# Patient Record
Sex: Female | Born: 1935 | Race: Black or African American | Hispanic: No | Marital: Married | Smoking: Never smoker
Health system: Southern US, Community
[De-identification: ages and names within clinical notes are randomized; demographics above are authoritative.]

## PROBLEM LIST (undated history)

## (undated) DIAGNOSIS — I639 Cerebral infarction, unspecified: Secondary | ICD-10-CM

## (undated) DIAGNOSIS — IMO0001 Reserved for inherently not codable concepts without codable children: Secondary | ICD-10-CM

## (undated) DIAGNOSIS — C801 Malignant (primary) neoplasm, unspecified: Secondary | ICD-10-CM

## (undated) DIAGNOSIS — C7989 Secondary malignant neoplasm of other specified sites: Secondary | ICD-10-CM

## (undated) DIAGNOSIS — C50912 Malignant neoplasm of unspecified site of left female breast: Secondary | ICD-10-CM

## (undated) DIAGNOSIS — IMO0002 Reserved for concepts with insufficient information to code with codable children: Secondary | ICD-10-CM

## (undated) DIAGNOSIS — K5792 Diverticulitis of intestine, part unspecified, without perforation or abscess without bleeding: Secondary | ICD-10-CM

## (undated) HISTORY — DX: Cerebral infarction, unspecified: I63.9

## (undated) HISTORY — PX: COLON SURGERY: SHX602

## (undated) HISTORY — PX: MASTECTOMY: SHX3

## (undated) HISTORY — PX: BREAST SURGERY: SHX581

## (undated) HISTORY — DX: Diverticulitis of intestine, part unspecified, without perforation or abscess without bleeding: K57.92

## (undated) HISTORY — DX: Malignant (primary) neoplasm, unspecified: C80.1

---

## 1987-04-18 DIAGNOSIS — I639 Cerebral infarction, unspecified: Secondary | ICD-10-CM

## 1987-04-18 HISTORY — DX: Cerebral infarction, unspecified: I63.9

## 1997-09-26 ENCOUNTER — Emergency Department (HOSPITAL_COMMUNITY): Admission: EM | Admit: 1997-09-26 | Discharge: 1997-09-26 | Payer: Self-pay | Admitting: Emergency Medicine

## 1997-09-28 ENCOUNTER — Emergency Department (HOSPITAL_COMMUNITY): Admission: EM | Admit: 1997-09-28 | Discharge: 1997-09-28 | Payer: Self-pay | Admitting: Emergency Medicine

## 1997-10-04 ENCOUNTER — Emergency Department (HOSPITAL_COMMUNITY): Admission: EM | Admit: 1997-10-04 | Discharge: 1997-10-04 | Payer: Self-pay | Admitting: Emergency Medicine

## 1999-05-12 ENCOUNTER — Encounter: Payer: Self-pay | Admitting: Neurology

## 1999-05-12 ENCOUNTER — Inpatient Hospital Stay (HOSPITAL_COMMUNITY): Admission: EM | Admit: 1999-05-12 | Discharge: 1999-06-07 | Payer: Self-pay | Admitting: Emergency Medicine

## 1999-05-13 ENCOUNTER — Encounter: Payer: Self-pay | Admitting: Neurology

## 1999-05-14 ENCOUNTER — Encounter: Payer: Self-pay | Admitting: Neurology

## 1999-05-15 ENCOUNTER — Encounter: Payer: Self-pay | Admitting: Neurology

## 1999-05-16 ENCOUNTER — Encounter: Payer: Self-pay | Admitting: Neurology

## 1999-05-18 ENCOUNTER — Encounter: Payer: Self-pay | Admitting: Neurology

## 1999-05-19 ENCOUNTER — Encounter: Payer: Self-pay | Admitting: Neurology

## 1999-05-21 ENCOUNTER — Encounter: Payer: Self-pay | Admitting: Cardiology

## 1999-05-24 ENCOUNTER — Encounter: Payer: Self-pay | Admitting: Neurology

## 1999-05-26 ENCOUNTER — Encounter: Payer: Self-pay | Admitting: Neurology

## 1999-06-01 ENCOUNTER — Encounter: Payer: Self-pay | Admitting: Neurology

## 1999-06-07 ENCOUNTER — Inpatient Hospital Stay
Admission: RE | Admit: 1999-06-07 | Discharge: 1999-06-24 | Payer: Self-pay | Admitting: Physical Medicine & Rehabilitation

## 1999-06-08 ENCOUNTER — Ambulatory Visit (HOSPITAL_COMMUNITY)
Admission: RE | Admit: 1999-06-08 | Discharge: 1999-06-08 | Payer: Self-pay | Admitting: Physical Medicine & Rehabilitation

## 1999-06-13 ENCOUNTER — Encounter: Payer: Self-pay | Admitting: Physical Medicine & Rehabilitation

## 1999-06-23 ENCOUNTER — Encounter: Payer: Self-pay | Admitting: Physical Medicine & Rehabilitation

## 1999-06-24 ENCOUNTER — Inpatient Hospital Stay (HOSPITAL_COMMUNITY)
Admission: RE | Admit: 1999-06-24 | Discharge: 1999-07-06 | Payer: Self-pay | Admitting: Physical Medicine and Rehabilitation

## 2000-01-20 ENCOUNTER — Encounter: Payer: Self-pay | Admitting: Emergency Medicine

## 2000-01-20 ENCOUNTER — Emergency Department (HOSPITAL_COMMUNITY): Admission: EM | Admit: 2000-01-20 | Discharge: 2000-01-20 | Payer: Self-pay | Admitting: Emergency Medicine

## 2000-01-27 ENCOUNTER — Ambulatory Visit (HOSPITAL_COMMUNITY): Admission: RE | Admit: 2000-01-27 | Discharge: 2000-01-27 | Payer: Self-pay | Admitting: *Deleted

## 2000-07-16 ENCOUNTER — Other Ambulatory Visit: Admission: RE | Admit: 2000-07-16 | Discharge: 2000-07-16 | Payer: Self-pay | Admitting: *Deleted

## 2001-09-05 ENCOUNTER — Encounter: Payer: Self-pay | Admitting: Emergency Medicine

## 2001-09-05 ENCOUNTER — Emergency Department (HOSPITAL_COMMUNITY): Admission: EM | Admit: 2001-09-05 | Discharge: 2001-09-05 | Payer: Self-pay | Admitting: Emergency Medicine

## 2002-01-14 ENCOUNTER — Encounter: Payer: Self-pay | Admitting: Internal Medicine

## 2002-01-14 ENCOUNTER — Encounter: Admission: RE | Admit: 2002-01-14 | Discharge: 2002-01-14 | Payer: Self-pay | Admitting: Internal Medicine

## 2002-01-29 ENCOUNTER — Encounter: Admission: RE | Admit: 2002-01-29 | Discharge: 2002-01-29 | Payer: Self-pay | Admitting: Internal Medicine

## 2002-01-29 ENCOUNTER — Encounter: Payer: Self-pay | Admitting: Internal Medicine

## 2002-01-31 ENCOUNTER — Encounter (INDEPENDENT_AMBULATORY_CARE_PROVIDER_SITE_OTHER): Payer: Self-pay

## 2002-01-31 ENCOUNTER — Encounter: Admission: RE | Admit: 2002-01-31 | Discharge: 2002-01-31 | Payer: Self-pay | Admitting: Internal Medicine

## 2002-01-31 ENCOUNTER — Encounter: Payer: Self-pay | Admitting: Internal Medicine

## 2002-02-12 ENCOUNTER — Encounter: Payer: Self-pay | Admitting: Internal Medicine

## 2002-02-12 ENCOUNTER — Encounter: Admission: RE | Admit: 2002-02-12 | Discharge: 2002-02-12 | Payer: Self-pay | Admitting: Internal Medicine

## 2002-02-12 ENCOUNTER — Encounter (INDEPENDENT_AMBULATORY_CARE_PROVIDER_SITE_OTHER): Payer: Self-pay | Admitting: *Deleted

## 2003-09-10 ENCOUNTER — Encounter: Admission: RE | Admit: 2003-09-10 | Discharge: 2003-09-10 | Payer: Self-pay | Admitting: General Surgery

## 2003-09-16 ENCOUNTER — Encounter (INDEPENDENT_AMBULATORY_CARE_PROVIDER_SITE_OTHER): Payer: Self-pay | Admitting: General Surgery

## 2003-09-16 ENCOUNTER — Encounter (INDEPENDENT_AMBULATORY_CARE_PROVIDER_SITE_OTHER): Payer: Self-pay | Admitting: Specialist

## 2003-09-16 ENCOUNTER — Inpatient Hospital Stay (HOSPITAL_COMMUNITY): Admission: RE | Admit: 2003-09-16 | Discharge: 2003-09-17 | Payer: Self-pay | Admitting: General Surgery

## 2003-11-23 ENCOUNTER — Ambulatory Visit (HOSPITAL_COMMUNITY): Admission: RE | Admit: 2003-11-23 | Discharge: 2003-11-23 | Payer: Self-pay | Admitting: Oncology

## 2003-12-04 ENCOUNTER — Ambulatory Visit (HOSPITAL_COMMUNITY): Admission: RE | Admit: 2003-12-04 | Discharge: 2003-12-04 | Payer: Self-pay | Admitting: Oncology

## 2003-12-31 ENCOUNTER — Encounter (INDEPENDENT_AMBULATORY_CARE_PROVIDER_SITE_OTHER): Payer: Self-pay | Admitting: *Deleted

## 2003-12-31 ENCOUNTER — Ambulatory Visit (HOSPITAL_COMMUNITY): Admission: RE | Admit: 2003-12-31 | Discharge: 2004-01-01 | Payer: Self-pay | Admitting: General Surgery

## 2003-12-31 HISTORY — PX: LYMPH NODE DISSECTION: SHX5087

## 2004-01-18 ENCOUNTER — Ambulatory Visit: Admission: RE | Admit: 2004-01-18 | Discharge: 2004-02-23 | Payer: Self-pay | Admitting: *Deleted

## 2004-03-09 ENCOUNTER — Ambulatory Visit: Payer: Self-pay | Admitting: Oncology

## 2004-03-15 ENCOUNTER — Encounter: Admission: RE | Admit: 2004-03-15 | Discharge: 2004-03-15 | Payer: Self-pay | Admitting: Oncology

## 2004-05-09 ENCOUNTER — Ambulatory Visit: Payer: Self-pay | Admitting: Oncology

## 2004-07-08 ENCOUNTER — Ambulatory Visit: Payer: Self-pay | Admitting: Oncology

## 2004-09-13 ENCOUNTER — Encounter: Admission: RE | Admit: 2004-09-13 | Discharge: 2004-09-13 | Payer: Self-pay | Admitting: Oncology

## 2004-10-07 ENCOUNTER — Ambulatory Visit: Payer: Self-pay | Admitting: Oncology

## 2005-02-22 ENCOUNTER — Ambulatory Visit: Payer: Self-pay | Admitting: Oncology

## 2005-06-21 ENCOUNTER — Ambulatory Visit: Payer: Self-pay | Admitting: Oncology

## 2005-09-18 ENCOUNTER — Encounter: Admission: RE | Admit: 2005-09-18 | Discharge: 2005-09-18 | Payer: Self-pay | Admitting: Oncology

## 2005-10-11 ENCOUNTER — Ambulatory Visit: Payer: Self-pay | Admitting: Oncology

## 2005-11-03 ENCOUNTER — Ambulatory Visit (HOSPITAL_COMMUNITY): Admission: RE | Admit: 2005-11-03 | Discharge: 2005-11-03 | Payer: Self-pay | Admitting: Oncology

## 2006-02-06 ENCOUNTER — Encounter: Admission: RE | Admit: 2006-02-06 | Discharge: 2006-02-06 | Payer: Self-pay | Admitting: Oncology

## 2006-02-08 ENCOUNTER — Ambulatory Visit (HOSPITAL_COMMUNITY): Admission: RE | Admit: 2006-02-08 | Discharge: 2006-02-08 | Payer: Self-pay | Admitting: Oncology

## 2006-02-21 ENCOUNTER — Ambulatory Visit: Payer: Self-pay | Admitting: Oncology

## 2006-03-02 LAB — CBC WITH DIFFERENTIAL/PLATELET
BASO%: 0.3 % (ref 0.0–2.0)
Basophils Absolute: 0 10*3/uL (ref 0.0–0.1)
EOS%: 0.6 % (ref 0.0–7.0)
HGB: 14.2 g/dL (ref 11.6–15.9)
MCH: 29.5 pg (ref 26.0–34.0)
MCHC: 34 g/dL (ref 32.0–36.0)
RDW: 14.6 % — ABNORMAL HIGH (ref 11.3–14.5)
lymph#: 1.4 10*3/uL (ref 0.9–3.3)

## 2006-03-02 LAB — COMPREHENSIVE METABOLIC PANEL
ALT: 14 U/L (ref 0–35)
AST: 19 U/L (ref 0–37)
Albumin: 4.2 g/dL (ref 3.5–5.2)
Calcium: 9.1 mg/dL (ref 8.4–10.5)
Chloride: 105 mEq/L (ref 96–112)
Potassium: 4.5 mEq/L (ref 3.5–5.3)
Sodium: 139 mEq/L (ref 135–145)
Total Protein: 7.1 g/dL (ref 6.0–8.3)

## 2006-06-27 ENCOUNTER — Ambulatory Visit: Payer: Self-pay | Admitting: Oncology

## 2006-06-29 LAB — LACTATE DEHYDROGENASE: LDH: 165 U/L (ref 94–250)

## 2006-06-29 LAB — COMPREHENSIVE METABOLIC PANEL
Albumin: 4.2 g/dL (ref 3.5–5.2)
CO2: 25 mEq/L (ref 19–32)
Calcium: 9.7 mg/dL (ref 8.4–10.5)
Glucose, Bld: 90 mg/dL (ref 70–99)
Potassium: 4.7 mEq/L (ref 3.5–5.3)
Sodium: 141 mEq/L (ref 135–145)
Total Protein: 7.2 g/dL (ref 6.0–8.3)

## 2006-06-29 LAB — CBC WITH DIFFERENTIAL/PLATELET
Eosinophils Absolute: 0 10*3/uL (ref 0.0–0.5)
LYMPH%: 22.5 % (ref 14.0–48.0)
MONO#: 0.3 10*3/uL (ref 0.1–0.9)
NEUT#: 4.3 10*3/uL (ref 1.5–6.5)
Platelets: 216 10*3/uL (ref 145–400)
RBC: 5.02 10*6/uL (ref 3.70–5.32)
WBC: 6.1 10*3/uL (ref 3.9–10.0)

## 2006-06-29 LAB — CANCER ANTIGEN 27.29: CA 27.29: 32 U/mL (ref 0–39)

## 2006-10-24 ENCOUNTER — Ambulatory Visit: Payer: Self-pay | Admitting: Oncology

## 2006-10-24 ENCOUNTER — Encounter: Admission: RE | Admit: 2006-10-24 | Discharge: 2006-10-24 | Payer: Self-pay | Admitting: Oncology

## 2006-10-26 LAB — CBC WITH DIFFERENTIAL/PLATELET
Basophils Absolute: 0 10*3/uL (ref 0.0–0.1)
EOS%: 0.5 % (ref 0.0–7.0)
HCT: 41.6 % (ref 34.8–46.6)
HGB: 14.4 g/dL (ref 11.6–15.9)
MCH: 29.8 pg (ref 26.0–34.0)
MCV: 86 fL (ref 81.0–101.0)
MONO%: 7.1 % (ref 0.0–13.0)
NEUT%: 61.3 % (ref 39.6–76.8)
Platelets: 220 10*3/uL (ref 145–400)

## 2006-10-26 LAB — COMPREHENSIVE METABOLIC PANEL
AST: 19 U/L (ref 0–37)
Alkaline Phosphatase: 61 U/L (ref 39–117)
BUN: 12 mg/dL (ref 6–23)
Calcium: 9.1 mg/dL (ref 8.4–10.5)
Chloride: 104 mEq/L (ref 96–112)
Creatinine, Ser: 1.15 mg/dL (ref 0.40–1.20)
Glucose, Bld: 83 mg/dL (ref 70–99)

## 2006-10-26 LAB — CANCER ANTIGEN 27.29: CA 27.29: 25 U/mL (ref 0–39)

## 2007-04-30 ENCOUNTER — Ambulatory Visit: Payer: Self-pay | Admitting: Oncology

## 2007-05-02 LAB — CBC WITH DIFFERENTIAL/PLATELET
BASO%: 0.1 % (ref 0.0–2.0)
Basophils Absolute: 0 10*3/uL (ref 0.0–0.1)
EOS%: 0.5 % (ref 0.0–7.0)
Eosinophils Absolute: 0 10*3/uL (ref 0.0–0.5)
HCT: 42.5 % (ref 34.8–46.6)
HGB: 14.4 g/dL (ref 11.6–15.9)
LYMPH%: 18.4 % (ref 14.0–48.0)
MCH: 29.2 pg (ref 26.0–34.0)
MCHC: 33.8 g/dL (ref 32.0–36.0)
MCV: 86.6 fL (ref 81.0–101.0)
MONO#: 0.4 10*3/uL (ref 0.1–0.9)
MONO%: 7.4 % (ref 0.0–13.0)
NEUT#: 4.1 10*3/uL (ref 1.5–6.5)
NEUT%: 73.6 % (ref 39.6–76.8)
Platelets: 191 10*3/uL (ref 145–400)
RBC: 4.91 10*6/uL (ref 3.70–5.32)
RDW: 14.9 % — ABNORMAL HIGH (ref 11.3–14.5)
WBC: 5.6 10*3/uL (ref 3.9–10.0)
lymph#: 1 10*3/uL (ref 0.9–3.3)

## 2007-05-02 LAB — COMPREHENSIVE METABOLIC PANEL
ALT: 17 U/L (ref 0–35)
Albumin: 4.2 g/dL (ref 3.5–5.2)
CO2: 24 mEq/L (ref 19–32)
Calcium: 9.2 mg/dL (ref 8.4–10.5)
Chloride: 102 mEq/L (ref 96–112)
Glucose, Bld: 84 mg/dL (ref 70–99)
Potassium: 4.1 mEq/L (ref 3.5–5.3)
Sodium: 138 mEq/L (ref 135–145)
Total Bilirubin: 1 mg/dL (ref 0.3–1.2)
Total Protein: 7.1 g/dL (ref 6.0–8.3)

## 2007-05-12 LAB — VITAMIN D PNL(25-HYDRXY+1,25-DIHY)-BLD: Vit D, 25-Hydroxy: 50 ng/mL (ref 30–89)

## 2007-10-28 ENCOUNTER — Encounter: Admission: RE | Admit: 2007-10-28 | Discharge: 2007-10-28 | Payer: Self-pay | Admitting: Oncology

## 2007-11-07 ENCOUNTER — Ambulatory Visit: Payer: Self-pay | Admitting: Oncology

## 2007-11-12 LAB — CBC WITH DIFFERENTIAL/PLATELET
EOS%: 0.4 % (ref 0.0–7.0)
Eosinophils Absolute: 0 10*3/uL (ref 0.0–0.5)
MCH: 29 pg (ref 26.0–34.0)
MCV: 86 fL (ref 81.0–101.0)
MONO%: 6.5 % (ref 0.0–13.0)
NEUT#: 3.8 10*3/uL (ref 1.5–6.5)
RBC: 4.92 10*6/uL (ref 3.70–5.32)
RDW: 15.6 % — ABNORMAL HIGH (ref 11.3–14.5)
lymph#: 1.5 10*3/uL (ref 0.9–3.3)

## 2007-11-13 LAB — COMPREHENSIVE METABOLIC PANEL
ALT: 19 U/L (ref 0–35)
AST: 23 U/L (ref 0–37)
Albumin: 4.2 g/dL (ref 3.5–5.2)
Alkaline Phosphatase: 61 U/L (ref 39–117)
Potassium: 4.4 mEq/L (ref 3.5–5.3)
Sodium: 141 mEq/L (ref 135–145)
Total Protein: 7.1 g/dL (ref 6.0–8.3)

## 2007-11-13 LAB — VITAMIN D 25 HYDROXY (VIT D DEFICIENCY, FRACTURES): Vit D, 25-Hydroxy: 43 ng/mL (ref 30–89)

## 2007-11-13 LAB — LACTATE DEHYDROGENASE: LDH: 166 U/L (ref 94–250)

## 2008-02-10 ENCOUNTER — Encounter: Admission: RE | Admit: 2008-02-10 | Discharge: 2008-02-10 | Payer: Self-pay | Admitting: Oncology

## 2008-05-27 ENCOUNTER — Ambulatory Visit: Payer: Self-pay | Admitting: Oncology

## 2008-05-29 LAB — CBC WITH DIFFERENTIAL/PLATELET
Basophils Absolute: 0 10*3/uL (ref 0.0–0.1)
Eosinophils Absolute: 0.1 10*3/uL (ref 0.0–0.5)
HCT: 44.2 % (ref 34.8–46.6)
HGB: 14.9 g/dL (ref 11.6–15.9)
NEUT#: 4.3 10*3/uL (ref 1.5–6.5)
NEUT%: 67.6 % (ref 39.6–76.8)
RDW: 15.1 % — ABNORMAL HIGH (ref 11.3–14.5)
lymph#: 1.6 10*3/uL (ref 0.9–3.3)

## 2008-06-01 LAB — CANCER ANTIGEN 27.29: CA 27.29: 28 U/mL (ref 0–39)

## 2008-06-01 LAB — COMPREHENSIVE METABOLIC PANEL
Albumin: 4.1 g/dL (ref 3.5–5.2)
BUN: 20 mg/dL (ref 6–23)
CO2: 26 mEq/L (ref 19–32)
Calcium: 9.7 mg/dL (ref 8.4–10.5)
Chloride: 105 mEq/L (ref 96–112)
Glucose, Bld: 83 mg/dL (ref 70–99)
Potassium: 4.3 mEq/L (ref 3.5–5.3)

## 2008-06-01 LAB — LACTATE DEHYDROGENASE: LDH: 158 U/L (ref 94–250)

## 2008-06-01 LAB — VITAMIN D 25 HYDROXY (VIT D DEFICIENCY, FRACTURES): Vit D, 25-Hydroxy: 44 ng/mL (ref 30–89)

## 2008-10-28 ENCOUNTER — Encounter: Admission: RE | Admit: 2008-10-28 | Discharge: 2008-10-28 | Payer: Self-pay | Admitting: Oncology

## 2008-11-24 ENCOUNTER — Ambulatory Visit: Payer: Self-pay | Admitting: Oncology

## 2008-11-26 LAB — CBC WITH DIFFERENTIAL/PLATELET
BASO%: 0.3 % (ref 0.0–2.0)
LYMPH%: 18.4 % (ref 14.0–49.7)
MCHC: 33.4 g/dL (ref 31.5–36.0)
MCV: 87.7 fL (ref 79.5–101.0)
MONO%: 6.4 % (ref 0.0–14.0)
Platelets: 197 10*3/uL (ref 145–400)
RBC: 4.83 10*6/uL (ref 3.70–5.45)
RDW: 14.9 % — ABNORMAL HIGH (ref 11.2–14.5)
WBC: 6.3 10*3/uL (ref 3.9–10.3)

## 2008-11-27 LAB — COMPREHENSIVE METABOLIC PANEL
AST: 19 U/L (ref 0–37)
Alkaline Phosphatase: 64 U/L (ref 39–117)
BUN: 15 mg/dL (ref 6–23)
Glucose, Bld: 90 mg/dL (ref 70–99)
Potassium: 4.5 mEq/L (ref 3.5–5.3)
Sodium: 141 mEq/L (ref 135–145)
Total Bilirubin: 1 mg/dL (ref 0.3–1.2)
Total Protein: 6.9 g/dL (ref 6.0–8.3)

## 2008-11-27 LAB — CANCER ANTIGEN 27.29: CA 27.29: 26 U/mL (ref 0–39)

## 2008-11-27 LAB — VITAMIN D 25 HYDROXY (VIT D DEFICIENCY, FRACTURES): Vit D, 25-Hydroxy: 32 ng/mL (ref 30–89)

## 2009-10-29 ENCOUNTER — Encounter: Admission: RE | Admit: 2009-10-29 | Discharge: 2009-10-29 | Payer: Self-pay | Admitting: Oncology

## 2009-11-24 ENCOUNTER — Ambulatory Visit: Payer: Self-pay | Admitting: Oncology

## 2009-11-26 LAB — LACTATE DEHYDROGENASE: LDH: 159 U/L (ref 94–250)

## 2009-11-26 LAB — COMPREHENSIVE METABOLIC PANEL
ALT: 16 U/L (ref 0–35)
Albumin: 4.2 g/dL (ref 3.5–5.2)
CO2: 22 mEq/L (ref 19–32)
Calcium: 9.6 mg/dL (ref 8.4–10.5)
Chloride: 106 mEq/L (ref 96–112)
Creatinine, Ser: 1.11 mg/dL (ref 0.40–1.20)

## 2009-11-26 LAB — CBC WITH DIFFERENTIAL/PLATELET
BASO%: 0.3 % (ref 0.0–2.0)
Basophils Absolute: 0 10*3/uL (ref 0.0–0.1)
HCT: 43.7 % (ref 34.8–46.6)
HGB: 14.7 g/dL (ref 11.6–15.9)
MCHC: 33.7 g/dL (ref 31.5–36.0)
MONO#: 0.4 10*3/uL (ref 0.1–0.9)
NEUT%: 65.7 % (ref 38.4–76.8)
WBC: 6.8 10*3/uL (ref 3.9–10.3)
lymph#: 1.8 10*3/uL (ref 0.9–3.3)

## 2010-02-11 ENCOUNTER — Encounter: Admission: RE | Admit: 2010-02-11 | Discharge: 2010-02-11 | Payer: Self-pay | Admitting: Oncology

## 2010-05-07 ENCOUNTER — Other Ambulatory Visit: Payer: Self-pay | Admitting: Oncology

## 2010-05-07 ENCOUNTER — Encounter: Payer: Self-pay | Admitting: Oncology

## 2010-05-07 DIAGNOSIS — Z9012 Acquired absence of left breast and nipple: Secondary | ICD-10-CM

## 2010-09-02 NOTE — Op Note (Signed)
NAME:  Erin Beasley, Erin Beasley                        ACCOUNT NO.:  [id number]   MEDICAL RECORD NO.:  [id number]                   PATIENT TYPE:  OIB   LOCATION:  2899                                 FACILITY:  [facility]   PHYSICIAN:  Rose Phi. Maple Hudson, M.D.                DATE OF BIRTH:  [birth year]   DATE OF PROCEDURE:  12/31/2003  DATE OF DISCHARGE:                                 OPERATIVE REPORT   PREOPERATIVE DIAGNOSIS:  T2 N1 carcinoma of the left breast.   POSTOPERATIVE DIAGNOSIS:  T2 N1 carcinoma of the left breast.   OPERATION:  Left completion axillary lymph node dissection.   SURGEON:  Rose Phi. Maple Hudson, M.D.   ANESTHESIA:  General.   OPERATIVE PROCEDURE:  After suitable general endotracheal anesthesia was  induced, the patient was placed in a supine position with the arms extended  on the arm board.  The left axilla and chest wall were prepped and draped in  the usual fashion.  A transverse left axillary incision was made with  dissection through the subcutaneous tissue down to the fascia and we exposed  the pectoralis major muscle and dissected along it in a cephalad direction  and then expose the clavipectoral fascia overlying the axillary vein.  We  incised also along the pectoralis minor muscle and retracted that, and then  we dissected out and swept all the tissue from caudad to the vein and deep  to the pectoralis minor.  Long thoracic and thoracodorsal nerves were  preserved after identifying them.  The cutaneous branches, nerves and  vessels were clipped and divided.  After removing the axillary contents, we  had good hemostasis.  We thoroughly irrigated the field with saline.  A 19  Blake drain was inserted and brought out through a separate stab wound.  The  subcutaneous tissue was closed with 3-0 Monocryl and the skin with staples.  A dressing applied.  The patient transferred to the recovery room in  satisfactory condition having tolerated the procedure well.                                        Rose Phi. Maple Hudson, M.D.    PRY/MEDQ  D:  12/31/2003  T:  12/31/2003  Job:  [id number]   cc:   Pierce Crane, M.D.  [address] [facility]  [place]  [place] [address]  Fax: [phone]

## 2010-09-02 NOTE — Procedures (Signed)
[place]. [facility]  Patient:    Erin Beasley                        MRN: [id number] Proc. Date: 05/12/99 Adm. Date:  [id number] Attending:  Glean Hess D                           Procedure Report  PROCEDURE:  Endotracheal intubation.  SURGEON:  Westly Pam. Iran Planas, M.D.  INDICATIONS: 1. Airway protection. 2. Left middle cerebral artery infarction.  ENDOTRACHEAL TUBE:  Number 7.5.  SEDATION:  ________ 10 mg IV.  TECHNIQUE:  Direct laryngoscopy.  DESCRIPTION OF PROCEDURE:  The patients vocal cords were visualized directly and endotracheal tube, 7.5 was passed over vocal cords into the trachea without any  difficulties or complications.  The patient tolerated the procedure well without complications.  Bilateral air sounds were heard and end-tidal CO2 monitor coloration switched to yellow.  A chest x-ray was obtained for verification of placement of the tip of tube. DD:  05/12/99 TD:  05/14/99 Job: [id number]

## 2010-09-02 NOTE — Consult Note (Signed)
[place]. [facility]  Patient:    Erin Beasley, Erin Beasley                       MRN: [id number] Proc. Date: 05/30/99 Adm. Date:  [id number] Attending:  Baldo Daub CC:         Westly Pam. Iran Planas, M.D.                          Consultation Report  Ms. Iran Planas is a 77 year old female whom I am asked to see by Dr. Iran Planas in order to  place a PEG tube for feeding purposes.  She was admitted January 25 with acute neurologic problems secondary to an embolic CVA which probably was secondary to  atrial fibrillation.  She has right-sided weakness and aphasia.  She is having difficulty swallowing and is being fed through a nasogastric feeding tube.  The  patient is able to nod her understanding when I attempt to explain what a PEG tube is.  PAST MEDICAL HISTORY:  Pertinent for hypertension.  CURRENT MEDICATIONS:  Include metoprolol, Diltiazem, digoxin, Levaquin, Osmolite, Zantac, and 1 daily aspirin.  ALLERGIES:  PENICILLIN.  FAMILY HISTORY AND SOCIAL HISTORY:  Only available from the chart.  REVIEW OF SYSTEMS:  Only available from the chart.  PHYSICAL EXAMINATION:  GENERAL:  She is a well-developed, well-nourished, adult female in no acute distress.  She has obvious right-sided weakness.  VITAL SIGNS:  Temperature 101.3, blood pressure 105/40.  HEENT:  Eyes are anicteric.  Oropharynx is unremarkable.  CHEST:  Clear at the apices with some scattered rhonchi at the bases.  HEART:  Sounds are irregularly irregular.  ABDOMEN:  Soft with normal bowel sounds and without mass.  The patient nods in agreement that she has some lower abdominal discomfort on deep palpation.  RECTAL:  Exam is not performed.  EXTREMITIES:  Without edema.  LABORATORY TESTS:  Hemoglobin 9 with a white blood count of 15.5, platelet count 363.  PTT 41, PT 16.2.  The patient is currently on heparin.  IMPRESSION:  A 77 year old female status post an embolic cerebrovascular accident with  dysphagia.  She is an appropriate candidate for PEG tube placement if her dysphagia persists; however, her heparin would have to be held for over 4 hours, and aspirin should be held for 4 days.  I would prefer that she is not febrile nd that this issue is sorted out a little more thoroughly before we proceed with PEG placement.  At any rate, this should be delayed until her aspirin antiplatelet effects have worn off somewhat.  I have scheduled her PEG placement for Thursday morning at 8.  Please hold the aspirin until afterwards, and we will stop the heparin Wednesday night and resume both following the PEG.  I will return to further discuss this with the patient and the family. DD:  05/30/99 TD:  05/30/99 Job: [id number]

## 2010-09-02 NOTE — H&P (Signed)
[place]. [facility]  Patient:    Erin Beasley                        MRN: [id number] Adm. Date:  [id number] Attending:  Glean Hess D                         History and Physical  CHIEF COMPLAINT:  Unable to speak, with right-sided weakness.  HISTORY OF PRESENT ILLNESS:  The patient is a 77 year old woman who was brought by EMS after she was found by her husband at around 9:45 a.m. this morning sitting in a chair leaning towards the right side, weak on her right side, and unable to speak.  According to the patients husband the last time she was seen in her usual state of health awake and alert, was at around 8:45 a.m.  Her husband went out o have a haircut, and when he returned to his house at around 9:45 a.m., he found the patient in this condition.  The patients husband called EMS, and she was transported urgently to [facility] Emergency Room for further  evaluation.  There is no prior history of strokes or TIAs.  Upon arrival to the emergency room it was noted that the patient was in atrial fibrillation with a rapid ventricular response rate between 170-180 per minute.  She was treated initially with hemodynamic support, IV fluids, normal saline, and given a dose of Cardizem as well.  PAST MEDICAL HISTORY:  Diverticulitis.  CURRENT MEDICATIONS:  None.  ALLERGIES:  PENICILLIN.  SOCIAL HISTORY:  Nonsmoker, nondrinker.  Lives with her husband.  Fairly independent.  FAMILY HISTORY:  Noncontributory.  Her primary care physician used to be Dr. Raynald Beasley, but he is currently retired, and they were in the process of getting a new primary care doctor.  REVIEW OF SYSTEMS:  Unable to obtain, due to the patients aphasia.  PHYSICAL EXAMINATION:  VITAL SIGNS:  Blood pressure 130/79, pulse 150, respirations 20, temperature 99.5 degrees.  Oxygen saturation 98% on 2 L nasal cannula.  GENERAL:  The patient is moderately obese,  laying on the stretcher, in no distress.  HEENT:  Head normocephalic, atraumatic.  NECK:  Supple, no bruits.  LUNGS:  Clear bilaterally.  HEART:  Sounds irregularly irregular.  ABDOMEN:  Soft, bowel sounds present, no visceromegaly.  EXTREMITIES:  No cyanosis or edema.  NEUROLOGIC:  The patient is awake and alert, opened her eyes, but she is globally aphasic.  She does not follow commands very properly.  Pupils equal, reactive bilaterally.  Extraocular cephalic movement intact.  There is no gaze deviation. Her face is slightly asymmetric with a right facial central palsy.  Her tongue s in the midline.  Motor examination displays a right hemiparesis involving the arm and the leg, as well as strength 2-3/5.  Deep tendon reflexes +1 throughout. Her plantar is upgoing on the right and downgoing on the left.  Gait was not evaluated at this time.  LABORATORY DATA:  WBC count 8.7, hemoglobin 12.3, hematocrit 37, platelets 416.  Differential:  Neutrophils 86%, lymphocytes 8%.  PTT 31, INR 1.5, sodium 141, potassium 4.4, chloride 110, bicarbonate 26, glucose 139, BUN 12, creatinine 0.9. Calcium 8.7, total protein 7.0, albumin 2.8, AST 25, ALT 22, bilirubin 0.7, CPK 29, MB 0.3, relative index 1.0.  Troponin less than 0.03.  Neuro imaging:  The patient is currently going to the CT  scanner as we speak.  IMPRESSION: 1. 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 2. Atrial fibrillation. 3. Diverticulosis.  The plan, recommendations, diagnosis, condition, and further intervention were discussed at length with the patients husband at the bedside.  The patient will be admitted to the neuro intensive care unit for further assessment, evaluation of her neurologic status, and control of her heart rate.  I personally directed the management and treatment to control her heart rate in the emergency room. Several doses of Cardizem have been given for this purpose intravenously.  At  the same ime her blood pressure was maintained with fluid resuscitation for hemodynamic support, several liters of normal saline.  Her heart rate has responded some from the 170s down to 120s to 130 at this time.  Her blood pressure was maintained in the range of a systolic of 110-130, with hemodynamic support on normal saline and several  doses of Aramine.  The patient also received a dose of aspirin here in the emergency room and was given oxygen 2 L.  The patient will be started on a heparin ischemic stroke protocol as soon as we know from her CAT scan that there is no intracranial hemorrhage.  I will expect that the patient will need long-term anticoagulation with Coumadin for stroke prevention.  At this time also we will  start her on a Cardizem drip to run at 50 mg per hour, to maintain her heart rate at less than 110.  The patient will remain n.p.o. and we will further obtain a swallowing evaluation by speech therapy in the morning.  We would also like to obtain a physical therapy and occupational therapy for further evaluation. Further studies will include noninvasive imaging of the intracranial and extracranial vessels, a carotid ultrasound, transcranial Doppler, and a cardiac evaluation with a 2-D echocardiogram.  Thank you for letting me participate in the care of this patient.  Total critical care time provided for the diagnosis, evaluation, and the management of this patient from 12:30 p.m. to 3:30 p.m., a total of three hours.DD:  05/12/99 TD:  05/12/99 Job: [id number]

## 2010-09-02 NOTE — Discharge Summary (Signed)
[place]. [facility]  Patient:    Erin Beasley, Erin Beasley                       MRN: [id number] Adm. Date:  [id number] Disc. Date: 06/23/99 Attending:  Herold Harms Dictator:   Bynum Bellows. Idacavage, P.A.C. CC:         Daniel L. Thomasena Edis, M.D.                           Discharge Summary  DIAGNOSES:  1. Status post cerebrovascular accident.  2. Atrial fibrillation.  3. Atrial thrombus.  4. Candida.  5. History of vancomycin-resistant enterococci urinary tract infection.  6. NPO status.  7. Prolonged ventilator wean.  HOSPITAL COURSE:  The patient is a 77 year old female who was initially brought by EMS on May 12, 1999 after being found by her husband, leaning to the right nd unable to speak.  She was noted to be in atrial fibrillation with a rapid ventricular response.  Her condition deteriorated at the emergency room and she  required intubation.  Initial CT was negative.  The patient was taken urgently o cerebral arteriogram.  No filling deficits were seen.  She was felt to have suffered a left MCA embolic infarct.  She was seen by Dr. Tresa Endo for AF.  Echo showed moderate TR.  The patient did fail obstipation and required tracheostomy  placement on February 5, and ultimately PEG tube on February 15.  A TEE performed on February 2 showed a mobile mass in the left atrial appendage consistent with  thrombus.  The patient initially had E. coli UTI which was treated with Cipro. She did have some bleeding from her tracheostomy and blood in her urine, which required her heparin to be held February 10.  She had anemia.  When she stabilized medically, she was transferred to subacute rehab on February 20, where she has participated in physical, occupational and speech therapies.  While on the SACU  unit, she was noted to have VRE UTI which was treated with Macrodantin.  She was also noted to have yeast in her urine, as well as oral thrush and yeast in  skin  folds, and she was placed on Diflucan for this.  Her trach was able to be decannulated on March 6 and she has tolerated this well.  She is for modified barium swallow today.  She has been NPO up until this time.  Currently she is moderate-assist with upper body dressing, max-assist with lower body dressing. She is minimal-assist with bed mobility, moderate-assist for sit to stand, and has ambulated 5 feet with a large-base quad cane and moderate-assist.  At this point in time, it is felt that she is a suitable candidate for transfer to the inpatient  rehab unit for further therapeutic intervention.  MEDICATIONS AT THE TIME OF TRANSFER:  1. Albuterol nebulizer 2.5 mg b.i.d. p.r.n.  2. Atrovent nebulizer 0.5 mg b.i.d. p.r.n.  3. Restoril 30 mg p.o. q.h.s. p.r.n.  4. Tylox every 4 hours per tube 5 ml.  5. Diflucan 100 mg suspension per tube q.d.  6. Coumadin 2 mg per tube q.d.  7. Senna 2 mg via PEG q.d.  8. Laxative of choice.  9. Tylenol 650 mg per tube q.4h. p.r.n. p.r.n. pain. 10. MicroGuard powder b.i.d. and p.r.n. 11. Jevity Plus 280 ml q.4h. per tube. 12. Cardizem suspension 40 mg q.6h. per tube. 13. Folvite 2 mg  per tube q.d. 14. Lopressor 50 mg q.12h. per tube. 15. Niferex 150 mg b.i.d. per tube. 16. Digoxin 0.25 mg q.o.d. alternating with 0.375 mg q.o.d. 17. Zantac 150 mg q.12h. per tube.  CONDITION ON DISCHARGE:  Stable. DD:  06/23/99 TD:  06/24/99 Job: [id number]

## 2010-10-31 ENCOUNTER — Ambulatory Visit: Payer: Self-pay

## 2010-11-08 ENCOUNTER — Ambulatory Visit
Admission: RE | Admit: 2010-11-08 | Discharge: 2010-11-08 | Disposition: A | Discharge status: Home / Self Care | Payer: Medicare Other | Source: Ambulatory Visit | Attending: Oncology | Admitting: Oncology

## 2010-11-08 DIAGNOSIS — Z9012 Acquired absence of left breast and nipple: Secondary | ICD-10-CM

## 2010-12-02 ENCOUNTER — Encounter (HOSPITAL_BASED_OUTPATIENT_CLINIC_OR_DEPARTMENT_OTHER): Payer: Medicare Other | Admitting: Oncology

## 2010-12-02 ENCOUNTER — Other Ambulatory Visit: Payer: Self-pay | Admitting: Oncology

## 2010-12-02 DIAGNOSIS — I4891 Unspecified atrial fibrillation: Secondary | ICD-10-CM

## 2010-12-02 DIAGNOSIS — C50919 Malignant neoplasm of unspecified site of unspecified female breast: Secondary | ICD-10-CM

## 2010-12-02 LAB — CBC WITH DIFFERENTIAL/PLATELET
Basophils Absolute: 0 10*3/uL (ref 0.0–0.1)
Eosinophils Absolute: 0.1 10*3/uL (ref 0.0–0.5)
HGB: 14.7 g/dL (ref 11.6–15.9)
MCV: 88.8 fL (ref 79.5–101.0)
MONO#: 0.4 10*3/uL (ref 0.1–0.9)
MONO%: 6.2 % (ref 0.0–14.0)
NEUT#: 4.5 10*3/uL (ref 1.5–6.5)
RBC: 4.97 10*6/uL (ref 3.70–5.45)
RDW: 14.8 % — ABNORMAL HIGH (ref 11.2–14.5)
WBC: 6.9 10*3/uL (ref 3.9–10.3)
lymph#: 1.9 10*3/uL (ref 0.9–3.3)

## 2010-12-02 LAB — COMPREHENSIVE METABOLIC PANEL
AST: 22 U/L (ref 0–37)
Albumin: 4.2 g/dL (ref 3.5–5.2)
Alkaline Phosphatase: 67 U/L (ref 39–117)
Calcium: 9.5 mg/dL (ref 8.4–10.5)
Chloride: 103 mEq/L (ref 96–112)
Glucose, Bld: 82 mg/dL (ref 70–99)
Potassium: 4.6 mEq/L (ref 3.5–5.3)
Sodium: 139 mEq/L (ref 135–145)
Total Protein: 7.2 g/dL (ref 6.0–8.3)

## 2010-12-09 ENCOUNTER — Encounter (HOSPITAL_BASED_OUTPATIENT_CLINIC_OR_DEPARTMENT_OTHER): Payer: Medicare Other | Admitting: Oncology

## 2011-11-10 ENCOUNTER — Telehealth: Payer: Self-pay | Admitting: Oncology

## 2011-11-10 ENCOUNTER — Other Ambulatory Visit: Payer: Self-pay | Admitting: Oncology

## 2011-11-10 DIAGNOSIS — Z1231 Encounter for screening mammogram for malignant neoplasm of breast: Secondary | ICD-10-CM

## 2011-11-10 DIAGNOSIS — Z853 Personal history of malignant neoplasm of breast: Secondary | ICD-10-CM

## 2011-11-10 NOTE — Telephone Encounter (Signed)
S/w pt and also husband re appts for 9/27 and 10/4 mammo 8/8.

## 2011-11-23 ENCOUNTER — Ambulatory Visit
Admission: RE | Admit: 2011-11-23 | Discharge: 2011-11-23 | Disposition: A | Discharge status: Home / Self Care | Payer: Medicare Other | Source: Ambulatory Visit | Attending: Oncology | Admitting: Oncology

## 2011-11-23 DIAGNOSIS — Z853 Personal history of malignant neoplasm of breast: Secondary | ICD-10-CM

## 2011-11-23 DIAGNOSIS — Z1231 Encounter for screening mammogram for malignant neoplasm of breast: Secondary | ICD-10-CM

## 2012-01-09 ENCOUNTER — Telehealth: Payer: Self-pay | Admitting: *Deleted

## 2012-01-09 NOTE — Telephone Encounter (Signed)
per reschedule to 01-23-2012 starting at 3:00pm patient confirmed over the phone the new date and time

## 2012-01-12 ENCOUNTER — Other Ambulatory Visit (HOSPITAL_BASED_OUTPATIENT_CLINIC_OR_DEPARTMENT_OTHER): Payer: Medicare Other | Admitting: Lab

## 2012-01-12 ENCOUNTER — Other Ambulatory Visit: Payer: Self-pay | Admitting: Oncology

## 2012-01-12 ENCOUNTER — Other Ambulatory Visit: Payer: Self-pay | Admitting: *Deleted

## 2012-01-12 DIAGNOSIS — C50919 Malignant neoplasm of unspecified site of unspecified female breast: Secondary | ICD-10-CM

## 2012-01-12 DIAGNOSIS — M949 Disorder of cartilage, unspecified: Secondary | ICD-10-CM

## 2012-01-12 DIAGNOSIS — M899 Disorder of bone, unspecified: Secondary | ICD-10-CM

## 2012-01-12 LAB — CBC WITH DIFFERENTIAL/PLATELET
BASO%: 0.4 % (ref 0.0–2.0)
Basophils Absolute: 0 10e3/uL (ref 0.0–0.1)
EOS%: 0.9 % (ref 0.0–7.0)
Eosinophils Absolute: 0.1 10e3/uL (ref 0.0–0.5)
HCT: 46.7 % — ABNORMAL HIGH (ref 34.8–46.6)
HGB: 15.5 g/dL (ref 11.6–15.9)
LYMPH%: 23.7 % (ref 14.0–49.7)
MCH: 29.7 pg (ref 25.1–34.0)
MCHC: 33.2 g/dL (ref 31.5–36.0)
MCV: 89.5 fL (ref 79.5–101.0)
MONO#: 0.5 10e3/uL (ref 0.1–0.9)
MONO%: 7.6 % (ref 0.0–14.0)
NEUT#: 4.6 10e3/uL (ref 1.5–6.5)
NEUT%: 67.4 % (ref 38.4–76.8)
Platelets: 207 10e3/uL (ref 145–400)
RBC: 5.22 10e6/uL (ref 3.70–5.45)
RDW: 14.9 % — ABNORMAL HIGH (ref 11.2–14.5)
WBC: 6.9 10e3/uL (ref 3.9–10.3)
lymph#: 1.6 10e3/uL (ref 0.9–3.3)

## 2012-01-12 LAB — COMPREHENSIVE METABOLIC PANEL (CC13)
AST: 25 U/L (ref 5–34)
Albumin: 3.6 g/dL (ref 3.5–5.0)
BUN: 19 mg/dL (ref 7.0–26.0)
CO2: 26 mEq/L (ref 22–29)
Calcium: 9.6 mg/dL (ref 8.4–10.4)
Chloride: 106 mEq/L (ref 98–107)
Creatinine: 1.1 mg/dL (ref 0.6–1.1)
Glucose: 92 mg/dl (ref 70–99)
Potassium: 4.5 mEq/L (ref 3.5–5.1)

## 2012-01-13 LAB — VITAMIN D 25 HYDROXY (VIT D DEFICIENCY, FRACTURES): Vit D, 25-Hydroxy: 61 ng/mL (ref 30–89)

## 2012-01-19 ENCOUNTER — Ambulatory Visit: Payer: Medicare Other | Admitting: Oncology

## 2012-01-23 ENCOUNTER — Ambulatory Visit (HOSPITAL_BASED_OUTPATIENT_CLINIC_OR_DEPARTMENT_OTHER): Payer: Medicare Other | Admitting: Oncology

## 2012-01-23 VITALS — BP 95/68 | HR 88 | Temp 97.6°F | Resp 20 | Ht 64.5 in | Wt 185.4 lb

## 2012-01-23 DIAGNOSIS — Z170 Estrogen receptor positive status [ER+]: Secondary | ICD-10-CM

## 2012-01-23 DIAGNOSIS — L905 Scar conditions and fibrosis of skin: Secondary | ICD-10-CM

## 2012-01-23 DIAGNOSIS — C50919 Malignant neoplasm of unspecified site of unspecified female breast: Secondary | ICD-10-CM

## 2012-01-23 NOTE — Progress Notes (Signed)
Hematology and Oncology Follow Up Visit  Erin Beasley [id number] 1928-06-18 78 y.o. 01/23/2012 9:20 AM   DIAGNOSIS:   78 year old woman with history of multinode-positive breast cancer status post mastectomy on left side, lymph node dissection September 2005, ER/PR positive on Arimidex x5 years.  History of strokes with resultant right-sided weakness  PAST THERAPY:    Interim History:  Patient is been doing well. She has no complaints. She is here today with her husband and daughter. She relates as does her daughter that she's been having some discomfort in her left scar line. Her most recent mammogram was in August of this year. Skin was normal.  Medications: I have reviewed the patient's current medications.  Allergies: Not on File  Past Medical History, Surgical history, Social history, and Family History were reviewed and updated.  Review of Systems: Constitutional:  Negative for fever, chills, night sweats, anorexia, weight loss, pain. Cardiovascular: no chest pain or dyspnea on exertion Respiratory: negative Neurological: negative Dermatological: negative ENT: negative Skin Gastrointestinal: negative Genito-Urinary: negative Hematological and Lymphatic: negative Breast: negative Musculoskeletal: negative Remaining ROS negative.  Physical Exam:  Blood pressure 95/68, pulse 88, temperature 97.6 F (36.4 C), resp. rate 20, height 5' 4.5" (1.638 m), weight 185 lb 6.4 oz (84.097 kg).  ECOG: 0  HEENT:  Sclerae anicteric, conjunctivae pink.  Oropharynx clear.  No mucositis or candidiasis.  Nodes:  No cervical, supraclavicular, or axillary lymphadenopathy palpated.  Breast Exam:  Right breast is benign.  No masses, discharge, skin change, or nipple inversion.  Left breast is status post mastectomy. Higher up in the scar line adjacent to the axilla there is a heaped up. Tissue  which has purplish discoloration. There is slight tenderness noted as well .Lungs:  Clear to  auscultation bilaterally.  No crackles, rhonchi, or wheezes.  Heart:  Regular rate and rhythm.  Abdomen:  Soft, nontender.  Positive bowel sounds.  No organomegaly or masses palpated.  Musculoskeletal:  No focal spinal tenderness to palpation.  Extremities:  Benign.  No peripheral edema or cyanosis.  Skin:  Benign.  Neuro:  Nonfocal.    Lab Results: Lab Results  Component Value Date   WBC 6.9 01/12/2012   HGB 15.5 01/12/2012   HCT 46.7* 01/12/2012   MCV 89.5 01/12/2012   PLT 207 01/12/2012     Chemistry      Component Value Date/Time   NA 141 01/12/2012 1332   NA 139 12/02/2010 1404   NA 139 12/02/2010 1404   K 4.5 01/12/2012 1332   K 4.6 12/02/2010 1404   K 4.6 12/02/2010 1404   CL 106 01/12/2012 1332   CL 103 12/02/2010 1404   CL 103 12/02/2010 1404   CO2 26 01/12/2012 1332   CO2 26 12/02/2010 1404   CO2 26 12/02/2010 1404   BUN 19.0 01/12/2012 1332   BUN 15 12/02/2010 1404   BUN 15 12/02/2010 1404   CREATININE 1.1 01/12/2012 1332   CREATININE 1.09 12/02/2010 1404   CREATININE 1.09 12/02/2010 1404      Component Value Date/Time   CALCIUM 9.6 01/12/2012 1332   CALCIUM 9.5 12/02/2010 1404   CALCIUM 9.5 12/02/2010 1404   ALKPHOS 69 01/12/2012 1332   ALKPHOS 67 12/02/2010 1404   ALKPHOS 67 12/02/2010 1404   AST 25 01/12/2012 1332   AST 22 12/02/2010 1404   AST 22 12/02/2010 1404   ALT 18 01/12/2012 1332   ALT 13 12/02/2010 1404   ALT 13 12/02/2010 1404   BILITOT  0.60 01/12/2012 1332   BILITOT 0.8 12/02/2010 1404   BILITOT 0.8 12/02/2010 1404       Radiological Studies:  No results found.   IMPRESSIONS AND PLAN: A 78 y.o. female with   History of multinode-positive breast cancer status post mastectomy on  AI therapy. She sees you doing well. She does have ongoing residual weakness on the right side. From a oncologic point of view I am little concerned about the appearance of the scar. I should also mention of some of his other smaller tiny erythematous acneform-type lesions on her anterior chest  which may represent dermatitis I recommended that she followup with a surgeon to see this ends be biopsied in the axilla. I plan to see her in followup in 3 months time as followup.Marland Kitchen   Spent more than half the time coordinating care, as well as discussion of BMI and its implications.      Primo Innis 10/8/20139:20 AM Cell [id number]

## 2012-01-24 ENCOUNTER — Telehealth (INDEPENDENT_AMBULATORY_CARE_PROVIDER_SITE_OTHER): Payer: Self-pay | Admitting: General Surgery

## 2012-01-24 ENCOUNTER — Telehealth: Payer: Self-pay | Admitting: Oncology

## 2012-01-24 NOTE — Telephone Encounter (Signed)
Spoke with patient and informed her that Dr. Donnie Coffin wanted her to be seen by Dr. Mauri Reading.  She said that she would like this so I set her an appt with [place] on 10/15 at 2:30.  I also sent her a reminder card in the mail.

## 2012-01-24 NOTE — Telephone Encounter (Signed)
Received a phone call from Erin Beasley at [facility] and the pt has been scheduled an appt with dr Cheral Almas and she is aware

## 2012-01-25 ENCOUNTER — Telehealth: Payer: Self-pay | Admitting: Oncology

## 2012-01-25 NOTE — Telephone Encounter (Signed)
S/w the pt and she is aware of her jan 2014 appts °

## 2012-01-30 ENCOUNTER — Encounter (INDEPENDENT_AMBULATORY_CARE_PROVIDER_SITE_OTHER): Payer: Self-pay | Admitting: General Surgery

## 2012-01-30 ENCOUNTER — Ambulatory Visit (INDEPENDENT_AMBULATORY_CARE_PROVIDER_SITE_OTHER): Payer: Medicare Other | Admitting: General Surgery

## 2012-01-30 VITALS — BP 104/68 | HR 74 | Temp 97.8°F | Resp 16 | Ht 64.0 in | Wt 185.2 lb

## 2012-01-30 DIAGNOSIS — R222 Localized swelling, mass and lump, trunk: Secondary | ICD-10-CM

## 2012-01-30 DIAGNOSIS — C50919 Malignant neoplasm of unspecified site of unspecified female breast: Secondary | ICD-10-CM

## 2012-01-30 NOTE — Progress Notes (Signed)
Patient ID: Erin Beasley, female   DOB: [birth year], 78 y.o.   MRN: [id number]  Chief Complaint  Patient presents with  . Breast Cancer Long Term Follow Up    HPI Erin Beasley is a 78 y.o. female.  Referred by Dr. Pierce Beasley HPI This is an 78 year old female with a history of node-positive left breast cancer treated with a left modified radical mastectomy and antiestrogen therapy for 5 years afterwards. She has been doing well since then. She a mammogram on the right side in August that was normal. She has noticed over the past several weeks and this was also noted by Dr. Donnie Beasley her medical oncologist and abnormality at the lateral aspect of her left mastectomy scar. This area is not really bothered her except it has been present. She comes in today to have this evaluated for possible biopsy. Past Medical History  Diagnosis Date  . Cancer 2008    breast   . Stroke   . Diverticulitis     Past Surgical History  Procedure Date  . Colon surgery   . Breast surgery     left mrm    Family History  Problem Relation Age of Onset  . Ovarian cancer Sister     Social History History  Substance Use Topics  . Smoking status: Never Smoker   . Smokeless tobacco: Not on file  . Alcohol Use: No    No Known Allergies  Current Outpatient Prescriptions  Medication Sig Dispense Refill  . calcium carbonate (TUMS - DOSED IN MG ELEMENTAL CALCIUM) 500 MG chewable tablet Chew 1 tablet by mouth daily.      . Cholecalciferol (VITAMIN D PO) Take by mouth.      . diltiazem (CARDIZEM CD) 180 MG 24 hr capsule       . famotidine (PEPCID) 20 MG tablet       . metoprolol (LOPRESSOR) 50 MG tablet       . simvastatin (ZOCOR) 20 MG tablet       . warfarin (COUMADIN) 5 MG tablet         Review of Systems Review of Systems  Constitutional: Negative for fever, chills and unexpected weight change.  HENT: Negative for hearing loss, congestion, sore throat, trouble swallowing and voice change.   Eyes:  Negative for visual disturbance.  Respiratory: Negative for cough and wheezing.   Cardiovascular: Negative for chest pain, palpitations and leg swelling.  Gastrointestinal: Negative for nausea, vomiting, abdominal pain, diarrhea, constipation, blood in stool, abdominal distention and anal bleeding.  Genitourinary: Negative for hematuria, vaginal bleeding and difficulty urinating.  Musculoskeletal: Negative for arthralgias.  Skin: Negative for rash and wound.  Neurological: Negative for seizures, syncope and headaches.  Hematological: Negative for adenopathy. Does not bruise/bleed easily.  Psychiatric/Behavioral: Negative for confusion.    Blood pressure 104/68, pulse 74, temperature 97.8 F (36.6 C), resp. rate 16, height 5\' 4"  (1.626 m), weight 185 lb 3.2 oz (84.006 kg).  Physical Exam Physical Exam  Pulmonary/Chest:      Data Reviewed Findings: The breast tissue is heterogeneously dense. No  suspicious masses, architectural distortion, or calcifications are  present.  Images were processed with CAD.  IMPRESSION:  No mammographic evidence of malignancy.  A result letter of this screening mammogram will be mailed directly  to the patient.    Assessment    History left breast cancer, possible chest wall recurrence    Plan    I'm concerned that this area is a recurrence in  her mastectomy scar. It certainly looks concerning. She is on Coumadin and they state that her last INR was 3 right now. I would like her off Coumadin for a couple of days prior to doing this. Will ask her primary care physician if we can wean her off her Coumadin for 3 days and then plan on doing a biopsy in the office. If this is negative we may need to excise this area. If this is positive then we can evaluate her decide to move forward.       Erin Beasley 01/30/2012, 2:40 PM

## 2012-02-05 ENCOUNTER — Telehealth (INDEPENDENT_AMBULATORY_CARE_PROVIDER_SITE_OTHER): Payer: Self-pay | Admitting: General Surgery

## 2012-02-05 NOTE — Telephone Encounter (Signed)
Spoke with patient and her husband and explained that per Dr. Dwain Sarna, he would like her to stop her coumadin tomorrow until Friday after we do her punch bx.  I have her scheduled Friday 10/25 at 11:40.  I also contacted their daughter.

## 2012-02-05 NOTE — Telephone Encounter (Signed)
Erin Beasley from Dr. Paulita Fujita office called to let me know that he said it was okay for her to stop her coumadin 4 days before the punch bx.

## 2012-02-09 ENCOUNTER — Other Ambulatory Visit (INDEPENDENT_AMBULATORY_CARE_PROVIDER_SITE_OTHER): Payer: Self-pay | Admitting: General Surgery

## 2012-02-09 ENCOUNTER — Ambulatory Visit (INDEPENDENT_AMBULATORY_CARE_PROVIDER_SITE_OTHER): Payer: Medicare Other | Admitting: General Surgery

## 2012-02-09 ENCOUNTER — Encounter (INDEPENDENT_AMBULATORY_CARE_PROVIDER_SITE_OTHER): Payer: Self-pay | Admitting: General Surgery

## 2012-02-09 VITALS — BP 125/79 | HR 81 | Temp 97.9°F | Resp 16 | Ht 65.0 in | Wt 186.0 lb

## 2012-02-09 DIAGNOSIS — C50919 Malignant neoplasm of unspecified site of unspecified female breast: Secondary | ICD-10-CM

## 2012-02-09 DIAGNOSIS — R222 Localized swelling, mass and lump, trunk: Secondary | ICD-10-CM

## 2012-02-09 HISTORY — PX: PUNCH BIOPSY OF SKIN: SHX6390

## 2012-02-09 NOTE — Patient Instructions (Addendum)
Remove dressing on Sunday morning unless need to sooner. Restart Coumadin on Saturday Leave biopsy site covered with gauze once daily until I see you again

## 2012-02-14 ENCOUNTER — Telehealth (INDEPENDENT_AMBULATORY_CARE_PROVIDER_SITE_OTHER): Payer: Self-pay

## 2012-02-14 NOTE — Progress Notes (Signed)
Subjective:     Patient ID: Erin Beasley, female   DOB: Oct 28, 1928, 78 y.o.   MRN: [id number]  HPI 27 yof who I saw last week with what appeared to be recurrence on left chest wall. She stopped her coumadin a couple days ago and comes in for biopsy in the office today.  She reports no changes.  Review of Systems     Objective:   Physical Exam Hard nodular mass with some bleeding right chest wall concerning for recurrence    Assessment:     Right chest wall mass    Plan:     We discussed the procedure.  I cleansed the area and then anesthetized with 1% lidocaine.  I then did a 6 mm punch biopsy times two in the mass as well as the nodularity.  I placed two stitches in these areas and held pressure until hemostasis was obtained.  A dressing was applied.  Will follow up next week to remove stitches.

## 2012-02-14 NOTE — Telephone Encounter (Signed)
Erin Beasley at [name] called stating punch bx came back met. Adeno CA ductal CA breast with margins involved. Pathologist wants to know if Dr Dwain Sarna wants erpr [place] ect. Ran on specimen. Pt [name] request Erin Beasley advised to run all tests.

## 2012-02-16 ENCOUNTER — Ambulatory Visit (INDEPENDENT_AMBULATORY_CARE_PROVIDER_SITE_OTHER): Payer: Medicare Other | Admitting: General Surgery

## 2012-02-16 ENCOUNTER — Encounter (INDEPENDENT_AMBULATORY_CARE_PROVIDER_SITE_OTHER): Payer: Self-pay | Admitting: General Surgery

## 2012-02-16 VITALS — BP 100/70 | HR 56 | Temp 98.2°F | Resp 16 | Ht 65.0 in | Wt 183.0 lb

## 2012-02-16 DIAGNOSIS — C50919 Malignant neoplasm of unspecified site of unspecified female breast: Secondary | ICD-10-CM

## 2012-02-16 NOTE — Progress Notes (Signed)
Subjective:     Patient ID: Erin Beasley, female   DOB: [birth year], 78 y.o.   MRN: [id number]  HPI 68 yof who had left chest wall mass after prior mastectomy.  I did biopsy last week and is consistent with ductal carcinoma of the breast. She returned today for stitch removal as well as to discuss this diagnosis. Review of Systems     Objective:   Physical Exam unchanged    Assessment:     Metastatic breast cancer    Plan:     I removed the stitch today. I discussed her case with medical oncology and I'm going to send her for a PET CT scan. We also discussed an excision of this area as it is causing her some pain in his grown out of her skin if it is feasible. I will plan on seeing her after her PET CT.

## 2012-02-23 ENCOUNTER — Encounter (HOSPITAL_COMMUNITY)
Admission: RE | Admit: 2012-02-23 | Discharge: 2012-02-23 | Disposition: A | Discharge status: Home / Self Care | Payer: Medicare Other | Source: Ambulatory Visit | Attending: General Surgery | Admitting: General Surgery

## 2012-02-23 ENCOUNTER — Encounter (HOSPITAL_COMMUNITY): Payer: Self-pay

## 2012-02-23 DIAGNOSIS — C50919 Malignant neoplasm of unspecified site of unspecified female breast: Secondary | ICD-10-CM | POA: Insufficient documentation

## 2012-02-23 MED ORDER — FLUDEOXYGLUCOSE F - 18 (FDG) INJECTION
20.4000 | Freq: Once | INTRAVENOUS | Status: AC | PRN
  Administered 2012-02-23: 20.4 via INTRAVENOUS

## 2012-03-07 ENCOUNTER — Ambulatory Visit (INDEPENDENT_AMBULATORY_CARE_PROVIDER_SITE_OTHER): Payer: Medicare Other | Admitting: General Surgery

## 2012-03-07 ENCOUNTER — Encounter (INDEPENDENT_AMBULATORY_CARE_PROVIDER_SITE_OTHER): Payer: Self-pay | Admitting: General Surgery

## 2012-03-07 VITALS — BP 108/68 | HR 64 | Temp 97.8°F | Resp 16 | Ht 66.0 in | Wt 181.8 lb

## 2012-03-07 DIAGNOSIS — C50919 Malignant neoplasm of unspecified site of unspecified female breast: Secondary | ICD-10-CM

## 2012-03-07 NOTE — Progress Notes (Signed)
Subjective:     Patient ID: Erin Beasley, female   DOB: [birth year], 78 y.o.   MRN: [id number]  HPI 43 yof who had left chest wall mass after prior mastectomy. I did biopsy last week and is consistent with ductal carcinoma of the breast. This is er positive at 100% and pr positive at 10%.  PET scan is as below and shows this area as well as mammary nodes and clavicle.  She returns today without any complaints referable to this area except it is there. It is not bleeding or causing any more issues right now.They come in to discuss options.   Review of Systems    NUCLEAR MEDICINE PET SKULL BASE TO THIGH  Fasting Blood Glucose: 104  Technique: 20.4 mCi F-18 FDG was injected intravenously. CT data  was obtained and used for attenuation correction and anatomic  localization only. (This was not acquired as a diagnostic CT  examination.) Additional exam technical data entered on  technologist worksheet.  Comparison: 02/08/2006  Findings:  Neck: An abnormal hypermetabolic region of the pharyngoesophageal  junction is observed eccentric to the right, with maximum standard  uptake value of 12.1. There is some soft tissue fullness posterior  to the right piriform sinus in this immediately supraglottic  region, and a soft tissue metastatic deposit is suspected as a  possibility. The possibility of Miss registered physiologic  glottic activity is not totally excluded but seems unlikely given  the configuration. There is clearly some degree of misregistration  of data. A left supraclavicular lymph node has a short axis  diameter of 1.6 cm and a maximum standard uptake value of 14.3.  Heterogeneous nodularity inferiorly in the thyroid noted with only  a vague activity similar to the rest of the thyroid gland.  Chest: Prior left mastectomy noted, with hypermetabolic activity  along the mass-like lateral margin of the mastectomy bed with  maximum standard uptake value of 19.2. The appearance favors  local  recurrence of mass. A 6 mm left axillary lymph node has a maximum  standard uptake value of 3.9, mildly hypermetabolic. Pathologic  left internal mammary lymph node has a short axis diameter of 7 mm  and maximum standard uptake value of 5.5  Dependent mild airspace opacity in the left lower lobe has a  maximum standard uptake value of 5.8, and may be inflammatory or  malignant.  Abdomen/Pelvis: No abnormal hypermetabolic activity within the  liver, pancreas, adrenal glands, or spleen. No hypermetabolic  lymph nodes in the abdomen or pelvis. Considerable laxity of the  anterior abdominal wall, query TRAM flap. Aortoiliac  atherosclerotic vascular disease noted. Hypodense lesions in the  liver are not hypermetabolic and are probably from cysts.  Skeleton: Lucency in the medial left clavicle associated with  hypermetabolic focus, maximum standard uptake value 7.5, favoring a  small osseous metastatic lesion of the medial clavicle over  arthropathy. Subtle sclerotic foci in the sternal body only have  equivocal associated activity and thus are nonspecific for osseous  metastatic disease, but merit careful attention.  IMPRESSION:  1. Hypermetabolic left lateral breast mass, with suspected  hypermetabolic left axillary and with hypermetabolic left  supraclavicular adenopathy.  2. Suspected small osseous metastatic lesion medially in the left  clavicle. Possible osseous metastatic lesions in the sternum.  3. Abnormal hypermetabolic region near the pharyngoesophageal  junction. There is clearly some misregistration in this vicinity.  This may represent a soft tissue metastatic lesion posterior to the  right piriform sinus, or less  likely an osseous metastatic lesion  in the vicinity of the prominent cervical spine spurring in this  area.  4. Hypermetabolic left internal mammary lymph node represents  metastatic disease.  5. The dependent mild airspace opacity left lower lobe is  mildly  hypermetabolic, and could be inflammatory or malignant.   Objective:   Physical Exam  Vitals reviewed. Pulmonary/Chest:         Assessment:     Stage IV breast cancer Chest wall recurrence    Plan:     We discussed the results of her PET scan today. I discussed this with Dr. Donnie Coffin previously and I talked to him about excising this area then followed by radiation therapy as well as antiestrogen therapy. I'm concerned on my exam today if I can excise this entire area and remove it all. It is not really cause any local problems at all right now I will. I think another consideration especially with her medical problems and Coumadin as well as a question of whether I can remove this would be to begin her on systemic therapy first to see if it can improve this. We would not rule out surgery down the road but I think this may be a better option right now. I will discuss this with Dr. Donnie Coffin.

## 2012-03-11 ENCOUNTER — Telehealth (INDEPENDENT_AMBULATORY_CARE_PROVIDER_SITE_OTHER): Payer: Self-pay | Admitting: General Surgery

## 2012-03-11 NOTE — Telephone Encounter (Signed)
Pt's daughter called with some questions for Dr. Dwain Sarna; she has shared the details of the last office visit with her siblings.  They all understand what Dr. Doreen Salvage concerns are and that he is also going to be conferring with Dr. Donnie Coffin.  The additional questions are:  (1) at what Stage is the current cancer?  (2) what impact will Coumadin have on any radiation treatments?  (3) relative to pt's heart and lungs, what is the probability of the cancer's growth over the the next 1-10 years depending on the treatment?  Daughter is aware that Dr. Dwain Sarna will likely not respond until next week, because of the holiday.  Stated she understands.

## 2012-03-13 ENCOUNTER — Telehealth: Payer: Self-pay | Admitting: *Deleted

## 2012-03-13 NOTE — Telephone Encounter (Signed)
Left message for pt to return my call so I can schedule a sooner appt w/ Dr. Donnie Coffin.

## 2012-03-15 ENCOUNTER — Telehealth: Payer: Self-pay | Admitting: Oncology

## 2012-03-15 ENCOUNTER — Other Ambulatory Visit: Payer: Self-pay | Admitting: Oncology

## 2012-03-15 MED ORDER — LETROZOLE 2.5 MG PO TABS: 2.5000 mg | ORAL_TABLET | Freq: Every day | ORAL | Status: DC

## 2012-03-15 NOTE — Addendum Note (Signed)
Addended by: Pierce Crane on: 03/15/2012 01:05 PM   Modules accepted: Orders

## 2012-03-15 NOTE — Telephone Encounter (Signed)
I talked to the daughter about Dr. Renelda Loma daughter about his recommendations.   She is concerned that Dr. Eula Listen is aware of the situation because of her mother's other medical conditions.    She agreed that Dr. Donnie Coffin could call in a prescription for Femara but she would not be able to pick it up until Monday or so- and her parents are out of town.  She wants to research it and think about the options.   She agreed to come in with her mother on Wednesday to see Dr. Donnie Coffin, however, if she and her parents decide to start this medication after researching it and they do not have any questions- they will cancel the appt on Wednesday.  I sent this email to [facility].   Jasmine December,  Femara (Trade) or Letrozole (generic name) is the medication that Dr Donnie Coffin is recommending.     Drugs.com and here is a link.      http://www.drugs.com/femara.html  Breastcancer.org is another good site-   here is a link to femara.   http://www.breastcancer.org/treatment/hormonal/aromatase_inhibitors/femara   Your Mom is scheduled Wednesday 03/20/12 at 4 PM.    I have cc:d Baltazar Apo also, so if you would like to cancel the Wednesday appt- please reply all- and Misty Stanley and I will change the appt.   Thanks! Tami

## 2012-03-18 ENCOUNTER — Encounter: Payer: Self-pay | Admitting: *Deleted

## 2012-03-18 NOTE — Progress Notes (Signed)
Received an email back from pt's daughter and they are going to go ahead w/ starting the medication that was discussed with Tami last week and f/ u w/ Dr. Donnie Coffin in Jan.

## 2012-03-20 ENCOUNTER — Ambulatory Visit: Payer: Medicare Other | Admitting: Oncology

## 2012-03-20 ENCOUNTER — Other Ambulatory Visit: Payer: Medicare Other | Admitting: Lab

## 2012-04-11 ENCOUNTER — Telehealth: Payer: Self-pay | Admitting: *Deleted

## 2012-04-11 NOTE — Telephone Encounter (Signed)
left voice message to inform the patient of the cancelled appointment 

## 2012-04-25 ENCOUNTER — Telehealth: Payer: Self-pay | Admitting: Oncology

## 2012-04-25 NOTE — Telephone Encounter (Signed)
Pt was scheduled tomorrow to be seen by Dr. Donnie Coffin to assess the Femara prescribed early December.   I offered a follow up here with Drs. Magrinat or Welton Flakes however her father (patient's husband) has seen Dr. Myna Hidalgo and would like to see him.  I sent a request to Dr. Myna Hidalgo and Raiford Noble to see if she could be seen at [facility].

## 2012-04-26 ENCOUNTER — Telehealth: Payer: Self-pay | Admitting: Hematology & Oncology

## 2012-04-26 ENCOUNTER — Other Ambulatory Visit: Payer: Medicare Other | Admitting: Lab

## 2012-04-26 ENCOUNTER — Ambulatory Visit: Payer: Medicare Other | Admitting: Oncology

## 2012-04-26 NOTE — Telephone Encounter (Signed)
Pt is aware of 05-08-12 appointment. Per e-mail from Tami she is out of town until 1-20

## 2012-05-08 ENCOUNTER — Ambulatory Visit (HOSPITAL_BASED_OUTPATIENT_CLINIC_OR_DEPARTMENT_OTHER): Payer: Medicare Other | Admitting: Hematology & Oncology

## 2012-05-08 ENCOUNTER — Other Ambulatory Visit (HOSPITAL_BASED_OUTPATIENT_CLINIC_OR_DEPARTMENT_OTHER): Payer: Medicare Other | Admitting: Lab

## 2012-05-08 ENCOUNTER — Ambulatory Visit: Payer: Medicare Other

## 2012-05-08 VITALS — BP 93/51 | HR 66 | Temp 97.7°F | Resp 16 | Ht 66.0 in | Wt 182.0 lb

## 2012-05-08 DIAGNOSIS — C50919 Malignant neoplasm of unspecified site of unspecified female breast: Secondary | ICD-10-CM

## 2012-05-08 DIAGNOSIS — Z853 Personal history of malignant neoplasm of breast: Secondary | ICD-10-CM

## 2012-05-08 DIAGNOSIS — C44599 Other specified malignant neoplasm of skin of other part of trunk: Secondary | ICD-10-CM

## 2012-05-08 DIAGNOSIS — E559 Vitamin D deficiency, unspecified: Secondary | ICD-10-CM

## 2012-05-08 DIAGNOSIS — Z9010 Acquired absence of unspecified breast and nipple: Secondary | ICD-10-CM

## 2012-05-08 DIAGNOSIS — M810 Age-related osteoporosis without current pathological fracture: Secondary | ICD-10-CM

## 2012-05-08 DIAGNOSIS — C7952 Secondary malignant neoplasm of bone marrow: Secondary | ICD-10-CM

## 2012-05-08 DIAGNOSIS — C44509 Unspecified malignant neoplasm of skin of other part of trunk: Secondary | ICD-10-CM

## 2012-05-08 LAB — CBC WITH DIFFERENTIAL (CANCER CENTER ONLY)
BASO#: 0 10*3/uL (ref 0.0–0.2)
EOS%: 1.3 % (ref 0.0–7.0)
Eosinophils Absolute: 0.1 10*3/uL (ref 0.0–0.5)
HGB: 14.5 g/dL (ref 11.6–15.9)
LYMPH%: 27 % (ref 14.0–48.0)
MCH: 28.9 pg (ref 26.0–34.0)
MCHC: 32.7 g/dL (ref 32.0–36.0)
MCV: 89 fL (ref 81–101)
MONO%: 7.8 % (ref 0.0–13.0)
NEUT#: 4.1 10*3/uL (ref 1.5–6.5)
Platelets: 175 10*3/uL (ref 145–400)
RBC: 5.01 10*6/uL (ref 3.70–5.32)

## 2012-05-08 NOTE — Progress Notes (Signed)
This office note has been dictated.

## 2012-05-09 LAB — COMPREHENSIVE METABOLIC PANEL
AST: 20 U/L (ref 0–37)
Alkaline Phosphatase: 67 U/L (ref 39–117)
BUN: 17 mg/dL (ref 6–23)
Glucose, Bld: 88 mg/dL (ref 70–99)
Total Bilirubin: 0.7 mg/dL (ref 0.3–1.2)

## 2012-05-09 NOTE — Progress Notes (Signed)
CC:   Juanetta Gosling, MD Georgann Housekeeper, MD  DIAGNOSIS:  Metastatic breast cancer-ER positive/PR negative.  CURRENT THERAPY:  Femara 2.5 mg p.o. daily.  INTERIM HISTORY:  Ms. House comes in for her first office visit.  She was seen at the main cancer center.  She was followed Dr. Donnie Coffin.  She initially had stage, I think, II, node-positive, breast cancer in the left breast.  She had a mastectomy.  She was on Arimidex for 5 years.  She recently developed recurrence at the mastectomy site.  This was noted by Dr. Donnie Coffin in his note back in October.  She was then referred to I think Dr. Dwain Sarna.  He went ahead and did a biopsy.  This is done on 02/09/2012.  The biopsy ([id number]) showed metastatic adenocarcinoma consistent with breast cancer.  The tumor was ER/PR positive and HER2 negative.  She had a PET scan done.  The PET scan confirmed recurrent/metastatic disease.  PET scan was done in early November.  This did show an area of hypermetabolism in the left lateral chest wall.  She had a hypermetabolic left axillary and left supraclavicular nodes.  There was also a possible left clavicular metastasis.  She had hypermetabolism in the left internal mammary nodes.  She was started on Femara right around Thanksgiving.  She does have some right-sided weakness because of a past CVA.  Her speech is good.  She feels well.  She is not hurting.  There is no bleeding at the site of this recurrence.  She has had no cough.  She has had no nausea or vomiting.  There has been no change in bowel or bladder habits.  She has had no arthralgias from the Femara.  She is on vitamin D.  She kindly was referred out to the [facility] as we do see her husband out here.  PHYSICAL EXAMINATION:  General:  This is an elderly, but very well- nourished black female in no obvious distress.  Vital signs: Temperature of 97.7, pulse 66, respiratory rate 16, blood pressure 95/50.   Weight is 182.  Head and neck:  Normocephalic, atraumatic skull. There are no ocular or oral lesions.  There are no palpable cervical or supraclavicular lymph nodes.  There may be some slight fullness in the left supraclavicular fossa, but no distinct lesion is noted.  Lungs: Clear bilaterally.  Cardiac:  Irregular rate and irregular rhythm consistent with atrial fibrillation.  Her rate is controlled.  She has no murmurs, rubs, or bruits.  Abdomen:  Soft with good bowel sounds. There is no palpable abdominal mass.  There is no palpable hepatosplenomegaly.  Chest wall:  Does show the nodular lesions on the lateral aspect of the mastectomy scar.  There is no bleeding associated with these.  There is no erythema associated with these.  There are no other areas of nodularity or erythema on the left anterior chest wall. There is some slight fullness in the left axilla.  Right breast is unremarkable.  There is no right axillary adenopathy.  Back:  No tenderness noted over the spine, ribs, or hips.  Extremities:  No clubbing, cyanosis, or edema.  She does have little use of the right arm.  LABORATORY STUDIES:  White cell count is 6.4, hemoglobin 14.5, hematocrit 44.4, platelet count 175.  IMPRESSION:  Ms. Gravelle is an 79 year old African American female with metastatic breast cancer.  She initially was diagnosed with locally advanced disease back in 2005.  She was placed on  Arimidex.  She now has recurrence.  She has metastatic disease from my point of view.  She has been on Femara now for about 2 months.  We will do another PET scan in about 2 or 3 weeks.  This should give [place] a good idea as to whether or not she is responding.  If she is responding, we will continue the Femara.  I may want to consider adding the Zometa.  This may have an added benefit for her.  If she is progressing, then I probably would consider Faslodex for her.  We have to keep in mind her quality of life.  She  has atrial fibrillation.  She had a past stroke.  She is on Coumadin.  It was very nice to see Ms. Cournoyer.  She is awful sweet.  We had good fellowship.  Again, will plan to get her back right after her PET scan is done.    ______________________________ Josph Macho, M.D. PRE/MEDQ  D:  05/08/2012  T:  05/09/2012  Job:  [id number]

## 2012-05-13 ENCOUNTER — Telehealth: Payer: Self-pay | Admitting: Hematology & Oncology

## 2012-05-13 NOTE — Telephone Encounter (Addendum)
Message copied by Cathi Roan on Mon May 13, 2012  4:17 PM ------      Message from: Josph Macho      Created: Sun May 12, 2012  4:36 PM       Call - dgtr - labs are ok!! Cindee Lame  05-13-12  4:20 pm  I called patients daughter, and spoke to [place] regarding above MD message. Verbalized understanding. Lupita Raider LPN

## 2012-06-03 ENCOUNTER — Encounter (HOSPITAL_COMMUNITY)
Admission: RE | Admit: 2012-06-03 | Discharge: 2012-06-03 | Disposition: A | Discharge status: Home / Self Care | Payer: Medicare Other | Source: Ambulatory Visit | Attending: Hematology & Oncology | Admitting: Hematology & Oncology

## 2012-06-03 DIAGNOSIS — C50919 Malignant neoplasm of unspecified site of unspecified female breast: Secondary | ICD-10-CM | POA: Insufficient documentation

## 2012-06-03 LAB — GLUCOSE, CAPILLARY: Glucose-Capillary: 102 mg/dL — ABNORMAL HIGH (ref 70–99)

## 2012-06-03 MED ORDER — FLUDEOXYGLUCOSE F - 18 (FDG) INJECTION
18.0000 | Freq: Once | INTRAVENOUS | Status: AC | PRN
  Administered 2012-06-03: 18 via INTRAVENOUS

## 2012-06-05 ENCOUNTER — Ambulatory Visit (HOSPITAL_BASED_OUTPATIENT_CLINIC_OR_DEPARTMENT_OTHER): Payer: Medicare Other | Admitting: Hematology & Oncology

## 2012-06-05 ENCOUNTER — Other Ambulatory Visit: Payer: Medicare Other | Admitting: Lab

## 2012-06-05 VITALS — BP 94/61 | HR 84 | Temp 98.1°F | Resp 18 | Ht 66.0 in | Wt 185.0 lb

## 2012-06-05 DIAGNOSIS — I4891 Unspecified atrial fibrillation: Secondary | ICD-10-CM

## 2012-06-05 DIAGNOSIS — C773 Secondary and unspecified malignant neoplasm of axilla and upper limb lymph nodes: Secondary | ICD-10-CM

## 2012-06-05 DIAGNOSIS — C50919 Malignant neoplasm of unspecified site of unspecified female breast: Secondary | ICD-10-CM

## 2012-06-05 DIAGNOSIS — C792 Secondary malignant neoplasm of skin: Secondary | ICD-10-CM

## 2012-06-05 DIAGNOSIS — I959 Hypotension, unspecified: Secondary | ICD-10-CM

## 2012-06-05 LAB — COMPREHENSIVE METABOLIC PANEL
AST: 18 U/L (ref 0–37)
Albumin: 3.8 g/dL (ref 3.5–5.2)
BUN: 16 mg/dL (ref 6–23)
CO2: 30 mEq/L (ref 19–32)
Calcium: 9.5 mg/dL (ref 8.4–10.5)
Chloride: 104 mEq/L (ref 96–112)
Creatinine, Ser: 1.1 mg/dL (ref 0.50–1.10)
Glucose, Bld: 83 mg/dL (ref 70–99)
Potassium: 4.6 mEq/L (ref 3.5–5.3)

## 2012-06-05 LAB — CBC WITH DIFFERENTIAL (CANCER CENTER ONLY)
BASO#: 0 10*3/uL (ref 0.0–0.2)
Eosinophils Absolute: 0.1 10*3/uL (ref 0.0–0.5)
HCT: 44.3 % (ref 34.8–46.6)
HGB: 14.5 g/dL (ref 11.6–15.9)
LYMPH#: 1.6 10*3/uL (ref 0.9–3.3)
LYMPH%: 22 % (ref 14.0–48.0)
MCV: 90 fL (ref 81–101)
MONO#: 0.5 10*3/uL (ref 0.1–0.9)
NEUT%: 69.2 % (ref 39.6–80.0)
WBC: 7.1 10*3/uL (ref 3.9–10.0)

## 2012-06-05 MED ORDER — DILTIAZEM HCL ER COATED BEADS 120 MG PO CP24: 120.0000 mg | ORAL_CAPSULE | Freq: Every day | ORAL | Status: DC

## 2012-06-05 MED ORDER — LETROZOLE 2.5 MG PO TABS: 2.5000 mg | ORAL_TABLET | Freq: Every day | ORAL | Status: DC

## 2012-06-05 NOTE — Progress Notes (Signed)
This office note has been dictated.

## 2012-06-05 NOTE — Addendum Note (Signed)
Addended by: Josph Macho on: 06/05/2012 06:33 PM   Modules accepted: Orders

## 2012-06-06 NOTE — Progress Notes (Signed)
CC:   Georgann Housekeeper, MD Juanetta Gosling, MD  DIAGNOSIS:  Metastatic breast cancer.  CURRENT THERAPY:  Femara 2.5 mg p.o. daily.  INTERIM HISTORY:  Ms. Bonifield comes in for followup.  We initially saw her back in January.  I did go ahead and repeat a PET scan on her.  The PET scan was compared to what was done back in October.  There is some residual hypermetabolism associated with postoperative changes and soft tissue thickening in the left lateral breast.  There is a smaller left supraclavicular lymph node.  There is focal hypermetabolism in the left clavicular head.  Overall, it appears that everything looks to be stable to slightly improved.  She is doing okay overall.  She does have atrial fibrillation.  She does have the partial paralysis on, I think, right side.  She is not complaining of pain.  Her appetite is okay.  She is having no nausea or vomiting.  There is no change in bowel or bladder habits. There is no bleeding.  PHYSICAL EXAMINATION:  General:  This is a elderly black female in no obvious distress.  She has the right-sided weakness.  Vital signs: Temperature of 98.1, pulse 84, respiratory rate 18, blood pressure 94/61.  Weight is 185.  Head and neck:  Normocephalic, atraumatic skull. There are no ocular or oral lesions.  There are no palpable cervical or supraclavicular lymph nodes.  Lungs:  Clear bilaterally.  There may be some slight decrease at the bases.  Cardiac:  Regular rate and rhythm with a normal S1 and S2.  There are no murmurs, rubs, or bruits. Abdomen:  Soft with good bowel sounds.  There is no palpable abdominal mass.  No palpable hepatosplenomegaly.  Back:  No tenderness over the spine, ribs, or hips.  Extremities:  Weakness on the right side.  She has a contracture of the right hand.  LABORATORY STUDIES:  White cell count is 7.1, hemoglobin 14.5, hematocrit 44.3, platelet count 203.  IMPRESSION:  Ms. Sivils is a nice 79 year old African  American female with metastatic breast cancer.  Her performance status is ECOG 2-3.  We have her on Femara.  It looks like from the PET scan that the Femara does seem to be helping.  There does not seem to be evidence of disease progression.  She is tolerating the Femara well.  She has a decent quality of life.  We will continue her on the Femara for now.  I will probably plan for another PET scan in about 3 months.  We will plan to get her back to see [place] in a couple of months.  Addendum:  Her blood pressure is a little on the low side.  I cut her diltiazem dose down to 120 mg p.o. daily.  She was on 180 mg.    ______________________________ Josph Macho, M.D. PRE/MEDQ  D:  06/05/2012  T:  06/06/2012  Job:  [id number]

## 2012-06-07 ENCOUNTER — Telehealth: Payer: Self-pay | Admitting: Hematology & Oncology

## 2012-06-07 NOTE — Telephone Encounter (Signed)
Left message on daughters cell phone (home no answer or voice mail) per Dr. Myna Hidalgo to call about 4-28 and 5-22 appointments.

## 2012-08-12 ENCOUNTER — Encounter (HOSPITAL_COMMUNITY)
Admission: RE | Admit: 2012-08-12 | Discharge: 2012-08-12 | Disposition: A | Discharge status: Home / Self Care | Payer: Medicare Other | Source: Ambulatory Visit | Attending: Hematology & Oncology | Admitting: Hematology & Oncology

## 2012-08-12 DIAGNOSIS — I4891 Unspecified atrial fibrillation: Secondary | ICD-10-CM | POA: Insufficient documentation

## 2012-08-12 DIAGNOSIS — C50919 Malignant neoplasm of unspecified site of unspecified female breast: Secondary | ICD-10-CM | POA: Insufficient documentation

## 2012-08-12 LAB — GLUCOSE, CAPILLARY: Glucose-Capillary: 89 mg/dL (ref 70–99)

## 2012-08-12 MED ORDER — FLUDEOXYGLUCOSE F - 18 (FDG) INJECTION
16.0000 | Freq: Once | INTRAVENOUS | Status: AC | PRN
  Administered 2012-08-12: 16 via INTRAVENOUS

## 2012-09-05 ENCOUNTER — Ambulatory Visit (HOSPITAL_BASED_OUTPATIENT_CLINIC_OR_DEPARTMENT_OTHER): Payer: Medicare Other | Admitting: Hematology & Oncology

## 2012-09-05 ENCOUNTER — Other Ambulatory Visit (HOSPITAL_BASED_OUTPATIENT_CLINIC_OR_DEPARTMENT_OTHER): Payer: Medicare Other | Admitting: Lab

## 2012-09-05 VITALS — BP 101/65 | HR 105 | Temp 97.8°F | Resp 16 | Ht 65.0 in | Wt 180.0 lb

## 2012-09-05 DIAGNOSIS — C773 Secondary and unspecified malignant neoplasm of axilla and upper limb lymph nodes: Secondary | ICD-10-CM

## 2012-09-05 DIAGNOSIS — R21 Rash and other nonspecific skin eruption: Secondary | ICD-10-CM

## 2012-09-05 DIAGNOSIS — B356 Tinea cruris: Secondary | ICD-10-CM

## 2012-09-05 DIAGNOSIS — C50919 Malignant neoplasm of unspecified site of unspecified female breast: Secondary | ICD-10-CM

## 2012-09-05 DIAGNOSIS — I4891 Unspecified atrial fibrillation: Secondary | ICD-10-CM

## 2012-09-05 LAB — COMPREHENSIVE METABOLIC PANEL
ALT: 13 U/L (ref 0–35)
Alkaline Phosphatase: 61 U/L (ref 39–117)
CO2: 28 mEq/L (ref 19–32)
Potassium: 4.6 mEq/L (ref 3.5–5.3)
Sodium: 143 mEq/L (ref 135–145)
Total Bilirubin: 0.8 mg/dL (ref 0.3–1.2)
Total Protein: 6.8 g/dL (ref 6.0–8.3)

## 2012-09-05 LAB — CBC WITH DIFFERENTIAL (CANCER CENTER ONLY)
BASO%: 0.3 % (ref 0.0–2.0)
LYMPH%: 19.7 % (ref 14.0–48.0)
MCV: 92 fL (ref 81–101)
MONO#: 0.5 10*3/uL (ref 0.1–0.9)
NEUT#: 4.5 10*3/uL (ref 1.5–6.5)
Platelets: 181 10*3/uL (ref 145–400)
RDW: 14.8 % (ref 11.1–15.7)
WBC: 6.3 10*3/uL (ref 3.9–10.0)

## 2012-09-05 LAB — CANCER ANTIGEN 27.29: CA 27.29: 54 U/mL — ABNORMAL HIGH (ref 0–39)

## 2012-09-05 MED ORDER — CLOTRIMAZOLE 1 % EX CREA: TOPICAL_CREAM | Freq: Two times a day (BID) | CUTANEOUS | Status: DC

## 2012-09-05 NOTE — Progress Notes (Signed)
This office note has been dictated.

## 2012-09-06 NOTE — Progress Notes (Signed)
CC:   Erin Housekeeper, MD Erin Gosling, MD  DIAGNOSIS:  Metastatic breast cancer.  CURRENT THERAPY:  Femara 2.5 mg p.o. daily.  INTERIM HISTORY:  Erin Beasley comes in for followup.  She is doing fairly well.  She still has the issues with respect to her CVA.  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  She still gets around fairly well.  She is doing okay with the Femara.  She has had no side effects from the Femara.  She has not noticed any obvious arthralgias.  There have been no rashes.  She has had no nausea or vomiting.  We have been following her CA27.29.  Her last level was 67 back in February.  We did go ahead and repeat a PET scan on her.  This was done on April 28.  The PET scan basically showed stable disease.  There was activity in the lateral aspect of the mastectomy bed.  This, again, is stable compared to her previous scan.  She had no areas of hypermetabolic uptake elsewhere outside of small left supraclavicular node.  She has liver lesions which are not hypermetabolic.  PHYSICAL EXAMINATION:  General:  This is an [name] in no obvious distress.  Vital signs:  Show temperature of 97.8, pulse 105, respiratory rate 16, blood pressure 105/65.  Weight is 180. Head and neck:  Shows a normocephalic, atraumatic skull.  There are no ocular or oral lesions.  There are no palpable cervical or supraclavicular lymph nodes.  Lungs:  Clear bilaterally.  Cardiac: Regular rate and rhythm with a normal S1, S2.  There are no murmurs, rubs or bruits.  Abdomen:  Soft with good bowel sounds.  There is no palpable abdominal mass.  There is no palpable hepatosplenomegaly. Breasts:  Show right breast with no masses, edema or erythema.  There is no right axillary adenopathy.  Left chest wall shows an area of recurrence.  These are nodular areas.  There is no bleeding from this. There may be some slight fungal infection associated with these areas. They  are on the lateral aspect of the mastectomy scar.  There is no left axillary adenopathy.  Back:  Shows no tenderness over the spine, ribs or hips.  Extremities:  Show no clubbing, cyanosis or edema.  Neurological: Shows no focal neurological deficits.  LABORATORY STUDIES:  White cell count 6.3, hemoglobin 14.6, hematocrit 44, platelet count 181.  IMPRESSION:  Erin Beasley is a very charming 79 year old African American female with metastatic breast cancer.  She is holding stable from my point of view.  She is on Femara.  I realize that her CA27.29 is going up.  However, the PET scan certainly looks stable compared to what she had done 3 months ago.  Her quality of life is doing quite well right now.  We will continue her on the Femara.  I did give her a prescription for Lotrimin cream to put on to the affected site on the left chest wall to help minimize fungal irritation.  We will plan to get her back in about 6 weeks.    ______________________________ Josph Macho, M.D. PRE/MEDQ  D:  09/05/2012  T:  09/06/2012  Job:  [id number]

## 2012-10-17 ENCOUNTER — Telehealth: Payer: Self-pay | Admitting: Hematology & Oncology

## 2012-10-17 ENCOUNTER — Other Ambulatory Visit (HOSPITAL_BASED_OUTPATIENT_CLINIC_OR_DEPARTMENT_OTHER): Payer: Medicare Other | Admitting: Lab

## 2012-10-17 ENCOUNTER — Ambulatory Visit (HOSPITAL_BASED_OUTPATIENT_CLINIC_OR_DEPARTMENT_OTHER): Payer: Medicare Other | Admitting: Hematology & Oncology

## 2012-10-17 VITALS — BP 104/68 | HR 100 | Temp 97.8°F | Resp 16 | Ht 65.0 in | Wt 180.0 lb

## 2012-10-17 DIAGNOSIS — B356 Tinea cruris: Secondary | ICD-10-CM

## 2012-10-17 DIAGNOSIS — C50919 Malignant neoplasm of unspecified site of unspecified female breast: Secondary | ICD-10-CM

## 2012-10-17 LAB — CBC WITH DIFFERENTIAL (CANCER CENTER ONLY)
BASO%: 0.3 % (ref 0.0–2.0)
EOS%: 1.5 % (ref 0.0–7.0)
LYMPH%: 19.8 % (ref 14.0–48.0)
MCH: 29.7 pg (ref 26.0–34.0)
MCV: 92 fL (ref 81–101)
MONO%: 8.4 % (ref 0.0–13.0)
Platelets: 188 10*3/uL (ref 145–400)
RDW: 14.6 % (ref 11.1–15.7)
WBC: 6.6 10*3/uL (ref 3.9–10.0)

## 2012-10-17 LAB — COMPREHENSIVE METABOLIC PANEL
ALT: 12 U/L (ref 0–35)
Alkaline Phosphatase: 57 U/L (ref 39–117)
Sodium: 144 mEq/L (ref 135–145)
Total Bilirubin: 0.8 mg/dL (ref 0.3–1.2)
Total Protein: 6.5 g/dL (ref 6.0–8.3)

## 2012-10-17 NOTE — Telephone Encounter (Signed)
Mailed pt PET instructions and schedule today.

## 2012-10-17 NOTE — Progress Notes (Signed)
This office note has been dictated.

## 2012-10-18 NOTE — Progress Notes (Signed)
CC:   Juanetta Gosling, MD Georgann Housekeeper, MD  DIAGNOSIS:  Metastatic breast cancer.  CURRENT THERAPY:  Femara 2.5 mg p.o. daily.  INTERIM HISTORY:  Ms Hackenberg comes in for followup.  She is doing fairly well.  She still has the neurological compromise from when she had the stroke.  She still has right-sided weakness.  She is doing okay on the Femara.  She is not complaining of any pain. Her appetite is doing okay.  She has had no nausea or vomiting.  Her last CA27.29 actually was a little bit better.  It was a 54.  This is down from 67.  She is going to be due for a PET scan in August, which we will set up.  There has been no problems with bowels or bladder.  She has had no leg swelling.  There has been no arm swelling.  PHYSICAL EXAMINATION:  General:  This is an [name] in no obvious distress.  Vital signs:  Show a temperature of 97.8, pulse 105, respiratory rate 16, blood pressure 104/68, weight is 180.  Head and neck:  Shows a normocephalic, atraumatic skull.  There are no ocular or oral lesions.  There are no palpable or cervical or supraclavicular lymph nodes.  Lungs:  Clear bilaterally.  Cardiac: Regular rate and rhythm consistent with atrial fibrillation.  There are no murmurs, rubs or bruits.  Breasts:  Shows right breast with no masses, edema or erythema.  Left chest wall does show the area of involvement.  This is the lateral left chest wall.  She has some erythema in this area.  It probably measures about 3 x 3 cm.  There is no tenderness.  There is no bleeding.  There is no obvious left axillary adenopathy.  Abdomen:  Soft.  There is no palpable abdominal mass. There is no palpable hepatosplenomegaly.  Extremities:  Show no clubbing, cyanosis or edema.  She has had marked decreased range of motion and movement of the right arm.  She has some weakness in the right leg.  LABORATORY STUDIES:  White cell count is 6.6, hemoglobin  14.6, hematocrit 45.3, platelet count is 188.  IMPRESSION:  Ms Thalman is a very charming 79 year old African female with metastatic breast cancer.  We have her on Femara.  We are following her with PET scans.  I am glad that her last CA27.29 in May was less.  We will have to see what the PET scan shows.  I think if we do find that she has progressive disease, I may switch her over to Aromasin or Faslodex.  We will see her back in August, about 1 or 2 weeks after her PET scan is done.    ______________________________ Josph Macho, M.D. PRE/MEDQ  D:  10/17/2012  T:  10/18/2012  Job:  [id number]

## 2012-11-04 ENCOUNTER — Other Ambulatory Visit: Payer: Self-pay | Admitting: *Deleted

## 2012-11-04 DIAGNOSIS — C50919 Malignant neoplasm of unspecified site of unspecified female breast: Secondary | ICD-10-CM

## 2012-11-04 DIAGNOSIS — I4891 Unspecified atrial fibrillation: Secondary | ICD-10-CM

## 2012-11-04 MED ORDER — DILTIAZEM HCL ER COATED BEADS 120 MG PO CP24: 120.0000 mg | ORAL_CAPSULE | Freq: Every day | ORAL | Status: DC

## 2012-11-21 ENCOUNTER — Encounter (HOSPITAL_COMMUNITY): Payer: Self-pay

## 2012-11-21 ENCOUNTER — Encounter (HOSPITAL_COMMUNITY)
Admission: RE | Admit: 2012-11-21 | Discharge: 2012-11-21 | Disposition: A | Discharge status: Home / Self Care | Payer: Medicare Other | Source: Ambulatory Visit | Attending: Hematology & Oncology | Admitting: Hematology & Oncology

## 2012-11-21 DIAGNOSIS — C50919 Malignant neoplasm of unspecified site of unspecified female breast: Secondary | ICD-10-CM

## 2012-11-21 LAB — GLUCOSE, CAPILLARY: Glucose-Capillary: 85 mg/dL (ref 70–99)

## 2012-11-21 MED ORDER — FLUDEOXYGLUCOSE F - 18 (FDG) INJECTION
19.1000 | Freq: Once | INTRAVENOUS | Status: AC | PRN
  Administered 2012-11-21: 19.1 via INTRAVENOUS

## 2012-11-22 ENCOUNTER — Telehealth: Payer: Self-pay | Admitting: *Deleted

## 2012-11-22 NOTE — Telephone Encounter (Signed)
Message copied by Anselm Jungling on Fri Nov 22, 2012  3:13 PM ------      Message from: Arlan Organ R      Created: Thu Nov 21, 2012  5:15 PM       Call her dgtr - PET scan looks pretty similar!! No obvious spread of the cancer!!  Cindee Lame ------

## 2012-11-22 NOTE — Telephone Encounter (Signed)
Called patient and patients daughter to let her know that her PET scan looks pretty similar to last one and there is no obvious spread of cancer

## 2012-12-03 ENCOUNTER — Ambulatory Visit (HOSPITAL_BASED_OUTPATIENT_CLINIC_OR_DEPARTMENT_OTHER): Payer: Medicare Other | Admitting: Hematology & Oncology

## 2012-12-03 ENCOUNTER — Other Ambulatory Visit (HOSPITAL_BASED_OUTPATIENT_CLINIC_OR_DEPARTMENT_OTHER): Payer: Medicare Other | Admitting: Lab

## 2012-12-03 VITALS — BP 96/73 | HR 111 | Temp 98.2°F | Resp 16 | Ht 65.0 in | Wt 181.0 lb

## 2012-12-03 DIAGNOSIS — C50919 Malignant neoplasm of unspecified site of unspecified female breast: Secondary | ICD-10-CM

## 2012-12-03 DIAGNOSIS — C50912 Malignant neoplasm of unspecified site of left female breast: Secondary | ICD-10-CM

## 2012-12-03 LAB — CBC WITH DIFFERENTIAL (CANCER CENTER ONLY)
BASO#: 0 10*3/uL (ref 0.0–0.2)
EOS%: 0.8 % (ref 0.0–7.0)
HCT: 45 % (ref 34.8–46.6)
HGB: 14.6 g/dL (ref 11.6–15.9)
LYMPH#: 1.5 10*3/uL (ref 0.9–3.3)
MCH: 29.7 pg (ref 26.0–34.0)
MCHC: 32.4 g/dL (ref 32.0–36.0)
NEUT%: 69.5 % (ref 39.6–80.0)

## 2012-12-03 NOTE — Progress Notes (Signed)
This office note has been dictated.

## 2012-12-03 NOTE — Progress Notes (Signed)
CC:   Georgann Housekeeper, MD Juanetta Gosling, MD  DIAGNOSIS:  Metastatic breast cancer.  CURRENT THERAPY:  Femara 2.5 mg p.o. daily.  INTERIM HISTORY:  Ms. Knoche comes in for her followup.  We last saw her right before July 4th weekend.  She is doing okay.  She apparently went up to [facility] for a family reunion.  She had a good time.  She is doing well with the residual from her CVA.  She still has right upper extremity weakness.  She is on Coumadin.  She has had no bleeding from the Coumadin.  We did go ahead and do a PET scan on her.  This was done on August 7th. The PET scan showed basically stable disease.  She had persistent hypermetabolic activity in the left lower chest wall.  There was some questionable activity in left supraclavicular fossa.  Otherwise, everything looked okay.  Her appetite is good.  She has had no nausea and vomiting.  She has had no cough.  He has had no dysphagia.  She has had no change in bowel or bladder habits.  Her last CA27.29 was 40.  PHYSICAL EXAMINATION:  General:  This is a well-developed, well- nourished African American female in no obvious distress.  Vital signs: Temperature of 98.2, pulse 111, respiratory rate 18, blood pressure 96/73.  Weight is 181.  Head and neck:  Normocephalic, atraumatic skull. There are no ocular or oral lesions.  There are no palpable cervical or supraclavicular lymph nodes.  Lungs:  Clear bilaterally.  Cardiac: Regular rate and rhythm with a normal S1 and S2.  There are no murmurs, rubs or bruits.  Breasts:  Right breast with no masses, edema or erythema.  There is no right axillary adenopathy.  Left chest wall shows well-healed mastectomy.  She has a 2 x 2 cm area of skin involvement by tumor on the lateral aspect of the left anterior chest wall.  This is not bleeding.  There is no left axillary adenopathy.  Abdomen:  Soft. She has good bowel sounds.  There is no palpable abdominal mass.  There is no  palpable hepatosplenomegaly.  Extremities:  No clubbing, cyanosis or edema.  Neurologic:  Shows the weakness in the right arm.  LABORATORY STUDIES:  White cell count is 7.2, hemoglobin 14.6, hematocrit 45, platelet count 191.  IMPRESSION:  Ms. Heldman is an 79 year old African American female with metastatic breast cancer.  Her disease is holding pretty stable.  For now, I do not see a need to make any changes with her Femara.  We will plan to get her back in 6 weeks.  We will go ahead and do another PET scan probably in November or December.    ______________________________ Josph Macho, M.D. PRE/MEDQ  D:  12/03/2012  T:  12/03/2012  Job:  [id number]

## 2012-12-04 LAB — COMPREHENSIVE METABOLIC PANEL
Albumin: 3.7 g/dL (ref 3.5–5.2)
Alkaline Phosphatase: 60 U/L (ref 39–117)
BUN: 16 mg/dL (ref 6–23)
CO2: 30 mEq/L (ref 19–32)
Calcium: 9.1 mg/dL (ref 8.4–10.5)
Glucose, Bld: 72 mg/dL (ref 70–99)
Potassium: 4.5 mEq/L (ref 3.5–5.3)

## 2012-12-11 ENCOUNTER — Other Ambulatory Visit: Payer: Self-pay | Admitting: *Deleted

## 2012-12-11 DIAGNOSIS — C50919 Malignant neoplasm of unspecified site of unspecified female breast: Secondary | ICD-10-CM

## 2012-12-11 DIAGNOSIS — I4891 Unspecified atrial fibrillation: Secondary | ICD-10-CM

## 2012-12-11 MED ORDER — DILTIAZEM HCL ER COATED BEADS 120 MG PO CP24: 120.0000 mg | ORAL_CAPSULE | Freq: Every day | ORAL | Status: DC

## 2012-12-11 MED ORDER — LETROZOLE 2.5 MG PO TABS: 2.5000 mg | ORAL_TABLET | Freq: Every day | ORAL | Status: DC

## 2013-01-14 ENCOUNTER — Ambulatory Visit (HOSPITAL_BASED_OUTPATIENT_CLINIC_OR_DEPARTMENT_OTHER): Payer: Medicare Other | Admitting: Hematology & Oncology

## 2013-01-14 ENCOUNTER — Other Ambulatory Visit: Payer: Medicare Other | Admitting: Lab

## 2013-01-14 VITALS — BP 95/58 | HR 92 | Temp 97.6°F | Resp 14 | Ht 65.0 in | Wt 182.0 lb

## 2013-01-14 DIAGNOSIS — C50911 Malignant neoplasm of unspecified site of right female breast: Secondary | ICD-10-CM

## 2013-01-14 DIAGNOSIS — C50912 Malignant neoplasm of unspecified site of left female breast: Secondary | ICD-10-CM

## 2013-01-14 DIAGNOSIS — C50919 Malignant neoplasm of unspecified site of unspecified female breast: Secondary | ICD-10-CM

## 2013-01-14 DIAGNOSIS — C792 Secondary malignant neoplasm of skin: Secondary | ICD-10-CM

## 2013-01-14 LAB — CBC WITH DIFFERENTIAL (CANCER CENTER ONLY)
BASO#: 0 10*3/uL (ref 0.0–0.2)
BASO%: 0.4 % (ref 0.0–2.0)
EOS%: 2.1 % (ref 0.0–7.0)
Eosinophils Absolute: 0.1 10*3/uL (ref 0.0–0.5)
HGB: 14.4 g/dL (ref 11.6–15.9)
LYMPH#: 1.3 10*3/uL (ref 0.9–3.3)
LYMPH%: 22.9 % (ref 14.0–48.0)
MCV: 90 fL (ref 81–101)
MONO%: 8.3 % (ref 0.0–13.0)
NEUT#: 3.7 10*3/uL (ref 1.5–6.5)
Platelets: 206 10*3/uL (ref 145–400)
RBC: 4.93 10*6/uL (ref 3.70–5.32)
RDW: 14.3 % (ref 11.1–15.7)

## 2013-01-14 LAB — CMP (CANCER CENTER ONLY)
ALT(SGPT): 18 U/L (ref 10–47)
Albumin: 3.5 g/dL (ref 3.3–5.5)
Alkaline Phosphatase: 66 U/L (ref 26–84)
BUN, Bld: 15 mg/dL (ref 7–22)
CO2: 32 mEq/L (ref 18–33)
Glucose, Bld: 93 mg/dL (ref 73–118)
Potassium: 4.7 mEq/L (ref 3.3–4.7)
Sodium: 142 mEq/L (ref 128–145)
Total Protein: 7.2 g/dL (ref 6.4–8.1)

## 2013-01-14 NOTE — Progress Notes (Signed)
Diagnosis:  Metastatic breast cancer  Current therapy:  Femara 2.5 mg by mouth daily  Interim history:  Erin Beasley comes in for followup of. She's been doing fairly well. According to her family, she's not any problems. She's doing well with the Femara. They've not noted any bleeding at the site of her skin and chest wall recurrence on the left side. There's been no pain.  Her appetite okay. She's had no nausea vomiting. There's been no change in bowel bladder habits.  She is on Coumadin. Is being monitored by her family doctor. She's had no bleeding.  She still has the weakness on the right side. This is pretty much stable.   Her last CA 27.29 was stable at 43.  His been no change in her medications. Her blood pressure is a little on the low side. She is on diltiazem and Lopressor.  On her physical exam: Is a fairly well-developed well-nourished [place] American female in no obvious distress. Vital signs her temperature of 97 6. Pulse 92. Respiratory rate 14 blood pressure is 95 or 58. Weight is 182 pounds. Head exam shows a normal-sized H. [place]. Dr. oral lesions. There are no palpable cervical or supraclavicular lymph nodes. Lungs are clear bilaterally. Cardiac exam irregular rate and irregular rhythm consistent with atrial fibrillation. She is a 1/6 systolic ejection murmur. This is chronic. Chest wall exam shows a stable chest wall lesion on the lateral aspect of the mastectomy scar. This measures about 2 x 2 cm. There is no bleeding. There is no exudate. There is no axillary adenopathy on the left. Right breast is unremarkable. No masses are noted. There is no palpable adenopathy. Abdomen is soft. She good bowel sounds. There is no fluid wave. There is no palpable hepatomegaly. Extremities shows some contraction of the right arm. This weakness on the right side. Left side strength is 4/5 bilaterally. Neurological exam shows the chronic neurological deficits on the right  side.  Laboratory studies: White cell count is 5.6. He 14.4. Hematocrit 44.5. Platelet count 206.  Impression: Erin Beasley is a 79 year old African female with metastatic breast cancer. She has the site of disease on the left anterior chest wall. Everything looks pretty stable clinically. She's doing well.  We will go ahead and plan for a followup PET scan after Thanksgiving. I this to be a reasonable amount time for followup.  If we do find she does have progressive disease, and we will go ahead and plan for a change of Femara probably to exemestane.  We'll see Erin Beasley back after she has her PET scan done.

## 2013-01-15 LAB — CANCER ANTIGEN 27.29: CA 27.29: 46 U/mL — ABNORMAL HIGH (ref 0–39)

## 2013-03-19 ENCOUNTER — Encounter (HOSPITAL_COMMUNITY): Payer: Self-pay

## 2013-03-19 ENCOUNTER — Encounter (HOSPITAL_COMMUNITY)
Admission: RE | Admit: 2013-03-19 | Discharge: 2013-03-19 | Disposition: A | Discharge status: Home / Self Care | Payer: Medicare Other | Source: Ambulatory Visit | Attending: Hematology & Oncology | Admitting: Hematology & Oncology

## 2013-03-19 DIAGNOSIS — M7989 Other specified soft tissue disorders: Secondary | ICD-10-CM | POA: Insufficient documentation

## 2013-03-19 DIAGNOSIS — N281 Cyst of kidney, acquired: Secondary | ICD-10-CM | POA: Insufficient documentation

## 2013-03-19 DIAGNOSIS — I77810 Thoracic aortic ectasia: Secondary | ICD-10-CM | POA: Insufficient documentation

## 2013-03-19 DIAGNOSIS — Z9010 Acquired absence of unspecified breast and nipple: Secondary | ICD-10-CM | POA: Insufficient documentation

## 2013-03-19 DIAGNOSIS — K7689 Other specified diseases of liver: Secondary | ICD-10-CM | POA: Insufficient documentation

## 2013-03-19 DIAGNOSIS — I2510 Atherosclerotic heart disease of native coronary artery without angina pectoris: Secondary | ICD-10-CM | POA: Insufficient documentation

## 2013-03-19 DIAGNOSIS — C50919 Malignant neoplasm of unspecified site of unspecified female breast: Secondary | ICD-10-CM | POA: Insufficient documentation

## 2013-03-19 DIAGNOSIS — I700 Atherosclerosis of aorta: Secondary | ICD-10-CM | POA: Insufficient documentation

## 2013-03-19 DIAGNOSIS — C50911 Malignant neoplasm of unspecified site of right female breast: Secondary | ICD-10-CM

## 2013-03-19 MED ORDER — FLUDEOXYGLUCOSE F - 18 (FDG) INJECTION
19.3000 | Freq: Once | INTRAVENOUS | Status: AC | PRN
  Administered 2013-03-19: 19.3 via INTRAVENOUS

## 2013-03-24 ENCOUNTER — Other Ambulatory Visit: Payer: Self-pay | Admitting: Nurse Practitioner

## 2013-03-24 ENCOUNTER — Ambulatory Visit (HOSPITAL_BASED_OUTPATIENT_CLINIC_OR_DEPARTMENT_OTHER): Payer: Medicare Other | Admitting: Hematology & Oncology

## 2013-03-24 ENCOUNTER — Telehealth: Payer: Self-pay | Admitting: Hematology & Oncology

## 2013-03-24 ENCOUNTER — Other Ambulatory Visit (HOSPITAL_BASED_OUTPATIENT_CLINIC_OR_DEPARTMENT_OTHER): Payer: Medicare Other | Admitting: Lab

## 2013-03-24 VITALS — BP 107/71 | HR 103 | Temp 98.1°F | Resp 12 | Ht 65.0 in | Wt 181.0 lb

## 2013-03-24 DIAGNOSIS — C50919 Malignant neoplasm of unspecified site of unspecified female breast: Secondary | ICD-10-CM

## 2013-03-24 DIAGNOSIS — C773 Secondary and unspecified malignant neoplasm of axilla and upper limb lymph nodes: Secondary | ICD-10-CM

## 2013-03-24 DIAGNOSIS — C50912 Malignant neoplasm of unspecified site of left female breast: Secondary | ICD-10-CM

## 2013-03-24 LAB — CBC WITH DIFFERENTIAL (CANCER CENTER ONLY)
BASO#: 0 10*3/uL (ref 0.0–0.2)
Eosinophils Absolute: 0.1 10*3/uL (ref 0.0–0.5)
HGB: 14.7 g/dL (ref 11.6–15.9)
LYMPH%: 29.2 % (ref 14.0–48.0)
MCV: 91 fL (ref 81–101)
MONO#: 0.4 10*3/uL (ref 0.1–0.9)
NEUT#: 3.9 10*3/uL (ref 1.5–6.5)
Platelets: 171 10*3/uL (ref 145–400)
RBC: 5 10*6/uL (ref 3.70–5.32)
WBC: 6.2 10*3/uL (ref 3.9–10.0)

## 2013-03-24 MED ORDER — EXEMESTANE 25 MG PO TABS: 25.0000 mg | ORAL_TABLET | Freq: Every day | ORAL | Status: DC

## 2013-03-24 NOTE — Telephone Encounter (Signed)
Mailed January schedule °

## 2013-03-24 NOTE — Progress Notes (Signed)
This office note has been dictated.

## 2013-03-25 ENCOUNTER — Other Ambulatory Visit: Payer: Self-pay | Admitting: *Deleted

## 2013-03-25 ENCOUNTER — Encounter: Payer: Self-pay | Admitting: Radiation Oncology

## 2013-03-25 DIAGNOSIS — C50912 Malignant neoplasm of unspecified site of left female breast: Secondary | ICD-10-CM

## 2013-03-25 LAB — COMPREHENSIVE METABOLIC PANEL
ALT: 10 U/L (ref 0–35)
CO2: 27 mEq/L (ref 19–32)
Calcium: 9.7 mg/dL (ref 8.4–10.5)
Chloride: 104 mEq/L (ref 96–112)
Creatinine, Ser: 1.14 mg/dL — ABNORMAL HIGH (ref 0.50–1.10)
Sodium: 142 mEq/L (ref 135–145)
Total Protein: 6.6 g/dL (ref 6.0–8.3)

## 2013-03-25 LAB — CANCER ANTIGEN 27.29: CA 27.29: 55 U/mL — ABNORMAL HIGH (ref 0–39)

## 2013-03-25 NOTE — Progress Notes (Signed)
CC:   Erin Housekeeper, MD  DIAGNOSES: 1. Metastatic breast cancer, recurrence on left chest wall. 2. Cerebrovascular accident with residual right-sided weakness.  CURRENT THERAPY: 1. Femara 2.5 mg p.o. daily. 2. Patient to start Aromasin 25 mg p.o. daily and discontinue the     Femara.  INTERVAL HISTORY:  Erin Beasley comes in for her followup.  She, herself, is doing well.  She had a good Thanksgiving.  She is still getting around fairly well despite the weakness over on the right side.  We did go ahead and repeat her PET scan.  This was done on December 3rd. Unfortunately, it looks like she may be progressing a little bit.  There was noted to be some increased hypermetabolism with the left lateral chest wall lesion.  This extends into the left axilla.  There are some small supraclavicular lymph nodes.  Does also have some increased SUV activity.  There are liver lesions which are not hypermetabolic.  I really think that we should change her therapy over to Aromasin.  She has been on Femara probably for a year or so.  I think we got a lot of mileage out of the Femara.  However, I think we need to make a change to see if we cannot get this tumor under a little bit better control.  I also think we need to get Radiation Therapy involved.  I spoke with Dr. Arnette Schaumann of Radiation Oncology.  He will be more than happy to see Erin Beasley.  Otherwise, __________ Erin Beasley is doing well.  Her quality of life is doing pretty well.  We have been able to treat her and preserve her quality of life at a fairly good level.  She has had no bleeding.  There has been no change in bowel or bladder habits.  She has had no cough.  She has had no headache.  There have been no swallowing difficulties.  She has not noticed any kind of leg swelling.  PHYSICAL EXAMINATION:  General:  This is an [name] in no obvious distress.  Vital Signs:  Temperature of 98.1,  pulse 103, respiratory rate 14, blood pressure 107/71.  Weight is 181 pounds. Head and Neck Exam:  Normocephalic, atraumatic skull.  There are no ocular or oral lesions.  There are no palpable cervical or supraclavicular lymph nodes.  Lungs:  Clear bilaterally.  There are no rales, wheezes, or rhonchi.  Cardiac Exam:  __________ rate and rhythm consistent with atrial fibrillation.  There are no murmurs, rubs, or bruits.  Chest wall exam does show the recurrence in the left lateral upper chest wall.  There is no bleeding associated with this.  There is no erythema.  This does seem to extend a little bit more radially now. There is no obvious left axillary adenopathy.  Abdomen:  Soft.  She has good bowel sounds.  There is no fluid wave.  There is no palpable hepatosplenomegaly.  Extremities:  The weakness on the right side. There is some slight trace edema on her right side.  She has good strength on her left side.  Skin Exam:  No rashes, ecchymoses, or petechia.  LABORATORY STUDIES:  White cell count is 6.2, hemoglobin 14.7, hematocrit 45.6, platelet count 171.  Her electrolytes and liver function tests look okay.  IMPRESSION:  Erin Beasley is a very nice 79 year old African American female.  She has this recurrence of metastatic breast cancer.  We have been following her now for a  couple of years, I think.  She has done very well.  Again, we do need to make a change from the Femara.  I think Aromasin would be a reasonable change for her.  We will see what Radiation Therapy can do to help her out.  I will plan to see her back in about 6 weeks' time.  By then, she should be done with her radiation therapy.  I spent a good half hour more with she and her family.  Her daughter takes care of most of her medical issues.  Her family is incredibly attentive and very proactive with Erin Beasley.    ______________________________ Josph Macho, M.D. PRE/MEDQ  D:  03/24/2013  T:   03/24/2013  Job:  [id number]

## 2013-03-25 NOTE — Progress Notes (Signed)
Location of Breast Cancer: left chest wall  Histology per Pathology Report: 02/09/2012                                                                            Skin , left chest wall, punch bx O METASTASTIC ADENOCARCINOMA, CONSISTENT WITH DUCTAL CARCINOMA OF THE BREAST  Receptor Status: ER(100%), PR (negative), Her2-neu (negative)  Did patient present with symptoms (if so, please note symptoms) or was this found on screening mammography?: she noticed an abnormality on the lateral aspect of her left mastectomy scar.  Past/Anticipated interventions by surgeon, if any: past left mastectomy, skin biopsy left chest wall  Past/Anticipated interventions by medical oncology, if any: 1. Femara 2.5 mg p.o. daily. 2. Patient to start Aromasin 25 mg p.o. daily and discontinue the  Femara per Dr. Myna Hidalgo.Marland Kitchen  Lymphedema issues, if any:No  Pain issues, if any:No  SAFETY ISSUES:  Prior radiation? Saw Dr. Dorna Bloom 01/2004  Pacemaker/ICD?No  Possible current pregnancy?No  Is the patient on methotrexate? no  Current Complaints / other details:Patient here with husband and daughter for consultation.Has right sided weakness from stroke in 1989 but is able to ambulate with minimal assistance.Main concern is transportation issues as daughter lives out of town in [place].    Eduardo Osier, RN 03/25/2013,11:59 AM

## 2013-03-26 ENCOUNTER — Ambulatory Visit
Admission: RE | Admit: 2013-03-26 | Discharge: 2013-03-26 | Disposition: A | Discharge status: Home / Self Care | Payer: Medicare Other | Source: Ambulatory Visit | Attending: Radiation Oncology | Admitting: Radiation Oncology

## 2013-03-26 ENCOUNTER — Telehealth: Payer: Self-pay | Admitting: *Deleted

## 2013-03-26 ENCOUNTER — Encounter: Payer: Self-pay | Admitting: Radiation Oncology

## 2013-03-26 VITALS — BP 104/56 | HR 132 | Temp 98.0°F | Wt 181.4 lb

## 2013-03-26 DIAGNOSIS — I700 Atherosclerosis of aorta: Secondary | ICD-10-CM | POA: Insufficient documentation

## 2013-03-26 DIAGNOSIS — Z7901 Long term (current) use of anticoagulants: Secondary | ICD-10-CM | POA: Insufficient documentation

## 2013-03-26 DIAGNOSIS — K7689 Other specified diseases of liver: Secondary | ICD-10-CM | POA: Insufficient documentation

## 2013-03-26 DIAGNOSIS — C50912 Malignant neoplasm of unspecified site of left female breast: Secondary | ICD-10-CM

## 2013-03-26 DIAGNOSIS — I69959 Hemiplegia and hemiparesis following unspecified cerebrovascular disease affecting unspecified side: Secondary | ICD-10-CM | POA: Insufficient documentation

## 2013-03-26 DIAGNOSIS — Z170 Estrogen receptor positive status [ER+]: Secondary | ICD-10-CM | POA: Insufficient documentation

## 2013-03-26 DIAGNOSIS — Z9010 Acquired absence of unspecified breast and nipple: Secondary | ICD-10-CM | POA: Insufficient documentation

## 2013-03-26 DIAGNOSIS — Z79899 Other long term (current) drug therapy: Secondary | ICD-10-CM | POA: Insufficient documentation

## 2013-03-26 DIAGNOSIS — C50919 Malignant neoplasm of unspecified site of unspecified female breast: Secondary | ICD-10-CM | POA: Insufficient documentation

## 2013-03-26 DIAGNOSIS — I77810 Thoracic aortic ectasia: Secondary | ICD-10-CM | POA: Insufficient documentation

## 2013-03-26 DIAGNOSIS — Z8041 Family history of malignant neoplasm of ovary: Secondary | ICD-10-CM | POA: Insufficient documentation

## 2013-03-26 DIAGNOSIS — I2510 Atherosclerotic heart disease of native coronary artery without angina pectoris: Secondary | ICD-10-CM | POA: Insufficient documentation

## 2013-03-26 NOTE — Telephone Encounter (Signed)
CALLED PATIENT TO REMIND OF 8 AM SIM FOR 03-27-13, SPOKE WITH PATIENT AND SHE IS AWARE OF THIS APPT.

## 2013-03-26 NOTE — Progress Notes (Signed)
Please see the Nurse Progress Note in the MD Initial Consult Encounter for this patient. 

## 2013-03-26 NOTE — Progress Notes (Signed)
Radiation Oncology         [phone] ________________________________  Initial outpatient Consultation  Name: Erin Beasley MRN: [id number]  Date: 03/26/2013  DOB: [birth year]  JY:NWGNFA,OZHYQM, MD  Josph Macho, MD   REFERRING PHYSICIAN: Josph Macho, MD  DIAGNOSIS: Recurrent left breast cancer  HISTORY OF PRESENT ILLNESS::Erin Beasley is a 79 y.o. female who is seen out of the courtesy of Dr. Arlan Organ for an opinion concerning radiation therapy as part of management of patient's recurrent left breast cancer.. the patient has a prior history of multinode-positive left breast cancer diagnosed in 2005. Patient was seen for postmastectomy irradiation at that time.   given the fact that she had 5/15 involved axillary nodes with extracapsular extension and LVI with the primary tumor, she was seen by Dr. Jackelyn Knife with the recommendation for this therapy. Patient decided against this treatment given her prior medical issues most notable for right sided weakness related to a CVA. Patient reasonably well until last fall when she was found to have a recurrence along the left chest wall area. She underwent a biopsy of a chest wall mass which revealed metastatic adenocarcinoma consistent with ductal carcinoma of the breast. Tumor was estrogen receptor positive at 100% and progesterone receptor weakly positive at 10%. Patient was seen by Dr. Myna Hidalgo and she was placed on Femara. She's been doing reasonably well over the past year however on recent PET scan there was noted to be more activity along the left chest wall mass. She is now seen  for consideration of treatment in light of the above issues.  PREVIOUS RADIATION THERAPY: No  PAST MEDICAL HISTORY:  has a past medical history of Stroke (1989); Diverticulitis; and Cancer (2008, 2013).    PAST SURGICAL HISTORY: Past Surgical History  Procedure Laterality Date  . Colon surgery    . Breast surgery      left mrm  . Lymph node  dissection Left 12/31/2003    axillary  . Mastectomy Left   . Punch biopsy of skin  02/09/2012    left chest wall    FAMILY HISTORY: family history includes Ovarian cancer in her sister.  SOCIAL HISTORY:  reports that she has never smoked. She does not have any smokeless tobacco history on file. She reports that she does not drink alcohol or use illicit drugs.  ALLERGIES: Review of patient's allergies indicates no known allergies.  MEDICATIONS:  Current Outpatient Prescriptions  Medication Sig Dispense Refill  . calcium carbonate (TUMS - DOSED IN MG ELEMENTAL CALCIUM) 500 MG chewable tablet Chew 1 tablet by mouth daily.      . Cholecalciferol (VITAMIN D PO) Take by mouth every morning.       . clotrimazole (LOTRIMIN) 1 % cream Apply topically 2 (two) times daily.  60 g  4  . diltiazem (CARDIZEM CD) 120 MG 24 hr capsule Take 1 capsule (120 mg total) by mouth daily.  30 capsule  3  . exemestane (AROMASIN) 25 MG tablet Take 1 tablet (25 mg total) by mouth daily after breakfast.  30 tablet  6  . famotidine (PEPCID) 20 MG tablet Take 20 mg by mouth daily.       . metoprolol (LOPRESSOR) 50 MG tablet Take 50 mg by mouth 2 (two) times daily.       . simvastatin (ZOCOR) 20 MG tablet Take 20 mg by mouth at bedtime.       Marland Kitchen warfarin (COUMADIN) 5 MG tablet Take 5 mg  by mouth daily. DOES NOT TAKE ON M-W-F.  ONLY ON T-T-S-S       No current facility-administered medications for this encounter.    REVIEW OF SYSTEMS:  A 15 point review of systems is documented in the electronic medical record. This was obtained by the nursing staff. However, I reviewed this with the patient to discuss relevant findings and make appropriate changes.  She denies any pain along the left chest or axillary region. She denies any drainage or bleeding from the area. She denies any cough or breathing problems. The patient denies any new bony pain headaches dizziness or blurred vision.   PHYSICAL EXAM:  weight is 181 lb 6.4 oz  (82.283 kg). Her temperature is 98 F (36.7 C). Her blood pressure is 104/56 and her pulse is 132. Her oxygen saturation is 96%.   BP 104/56  Pulse 132  Temp(Src) 98 F (36.7 C)  Wt 181 lb 6.4 oz (82.283 kg)  SpO2 96%  General Appearance:    Alert, cooperative, no distress, appears stated age, she is accompanied by her husband and daughter on evaluation today, she is unable to get up on the examination table by herself   Head:    Normocephalic, without obvious abnormality, atraumatic  Eyes:    PERRL, conjunctiva/corneas clear, EOM's intact,      Nose:   Nares normal, septum midline, mucosa normal, no drainage    or sinus tenderness  Throat:   Lips, mucosa, and tongue normal; dentures present, gums normal  Neck:   Supple, symmetrical, trachea midline, no adenopathy;    thyroid:  no enlargement/tenderness/nodules; no carotid   bruit or JVD  Back:     Symmetric, no curvature, ROM normal, no CVA tenderness  Lungs:     Clear to auscultation bilaterally, respirations unlabored  Chest Wall:    large palpable mass in the upper outer quadrant of the left chest wall area, 5 x 5 and half centimeters in size, skin involvement but no drainage or bleeding noted    Heart:    Regular rate and rhythm, S1 and S2 normal, no murmur, rub   or gallop  Breast Exam:    right breast without palpable mass nipple discharge or bleeding   Abdomen:     Soft, non-tender, bowel sounds active all four quadrants,    no masses, no organomegaly        Extremities:   Extremities normal, atraumatic, no cyanosis or edema  Pulses:   2+ and symmetric all extremities  Skin:   Skin color, texture, turgor normal, no rashes or lesions  Lymph nodes:   Cervical, supraclavicular, and axillary nodes normal  Neurologic:    significant right arm weakness and hand contraction, I am unable to detect any significant weakness in the right leg. Left motor strength is 5 out of 5 in the proximal and distal muscle groups of the upper and  lower extremity         ECOG = 2   LABORATORY DATA:  Lab Results  Component Value Date   WBC 6.2 03/24/2013   HGB 14.7 03/24/2013   HCT 45.6 03/24/2013   MCV 91 03/24/2013   PLT 171 03/24/2013   Lab Results  Component Value Date   NA 142 03/24/2013   K 4.3 03/24/2013   CL 104 03/24/2013   CO2 27 03/24/2013   Lab Results  Component Value Date   ALT 10 03/24/2013   AST 18 03/24/2013   ALKPHOS 71 03/24/2013  BILITOT 0.6 03/24/2013     RADIOGRAPHY: Nm Pet Image Restag (ps) Skull Base To Thigh  03/19/2013   CLINICAL DATA:  Restaging treatment strategy for right breast cancer.  EXAM: NUCLEAR MEDICINE PET SKULL BASE TO THIGH  FASTING BLOOD GLUCOSE:  Value:  99 mg/dl  TECHNIQUE: 16.1 mCi W-96 FDG was injected intravenously. CT data was obtained and used for attenuation correction and anatomic localization only. (This was not acquired as a diagnostic CT examination.) Additional exam technical data entered on technologist worksheet.  COMPARISON:  11/21/2012  FINDINGS: NECK  No hypermetabolic lymph nodes in the neck.  CHEST  Status post left mastectomy with left axillary lymph node dissection.  Again noted is wall thickening along the left lateral chest wall extending to the left axilla (series 2/image 90), with associated hypermetabolism, max SUV 21.0 , previously 17.3. Notably, this has continued to increased from max SUV 9.9 on 06/03/2012.  Again noted are small left supraclavicular nodes measuring up to 7-8 mm short axis (series 2/image 48), max SUV 6.5, previously 5.7. Notably, this has mildly increased from max SUV 3.3 on 06/03/2012.  No hypermetabolic mediastinal or hilar nodes.  No suspicious pulmonary nodules on the CT scan.  Cardiomegaly. Coronary atherosclerosis. Atherosclerotic calcifications of the aortic arch. Ectasia of the ascending thoracic aorta measuring up to 3.5 cm.  ABDOMEN/PELVIS  Multiple hypodense hepatic lesions, including two dominant lesions in the left hepatic lobe measuring  3.3 x 4.8 cm (series 2/images 116) and 4.6 x 6.0 cm (series 2/images 128), unchanged. No associated hypermetabolism.  No abnormal hypermetabolic activity within the pancreas, adrenal glands, or spleen.  2.3 x 2.3 cm posterior left upper pole renal cyst (series 2/image 121), without hypermetabolism.  No hypermetabolic lymph nodes in the abdomen or pelvis.  Laxity of the laxity with postsurgical changes involving the anterior abdominal wall.  SKELETON  No focal hypermetabolic activity to suggest skeletal metastasis.  IMPRESSION: Status post left mastectomy with left axillary lymph node dissection.  Soft tissue thickening along the left lateral chest wall extending to the left axilla, max SUV 21.0. Hypermetabolism has progressively increased from 06/03/2012 and remains worrisome for residual/recurrent disease.  Small left supraclavicular lymph nodes measuring 7-8 mm short axis, max SUV 6.5. Hypermetabolism has increased from 06/03/2012 and remains worrisome for nodal metastasis.  Multiple hypodense hepatic lesions, including two dominant lesions in the left hepatic lobe, non FDG avid.   Electronically Signed   By: Charline Bills M.D.   On: 03/19/2013 13:47      IMPRESSION: Recurrent left breast cancer. Patient has a significant chest wall recurrence which appears to be progressing on hormonal therapy per PET scan findings. The patient is being switched to Aromasin in light of this limited response with Femara.  I do feel the patient would be a candidate for limited radiation therapy directed at the area of recurrence along the left chest wall region. The left supraclavicular node is small and nonpalpable and I would not recommend treatment of this area as this would result in a larger field and more difficult treatment for the patient.  I discussed with patient that she has 2 options at this point. One option would be to receive with radiation to the area of recurrence or to assess her response to Aromasin.   Patient and her family would like to proceed with radiation therapy at this time.  PLAN: Simulation and planning in the next several days. Anticipate approximately 15 treatments directed at the area of recurrence.  I  spent 60 minutes minutes face to face with the patient and more than 50% of that time was spent in counseling and/or coordination of care.   ------------------------------------------------  -----------------------------------  Billie Lade, PhD, MD

## 2013-03-26 NOTE — Progress Notes (Signed)
Spoke with Sharyl Nimrod regarding transportation issues and she provided paperwork for [employer] which I gave to patient's daughter Mrs.Wilson as Data processing manager.I will check to see if there are any other transportation programs available.

## 2013-03-27 ENCOUNTER — Encounter: Payer: Self-pay | Admitting: Radiation Oncology

## 2013-03-27 ENCOUNTER — Ambulatory Visit: Payer: Medicare Other | Admitting: Radiation Oncology

## 2013-03-27 ENCOUNTER — Telehealth: Payer: Self-pay | Admitting: Oncology

## 2013-03-27 NOTE — Telephone Encounter (Signed)
error 

## 2013-03-27 NOTE — Progress Notes (Signed)
   Department of Radiation Oncology  Phone:  [phone] Fax:        [phone]  Progress note  Today the patient was scheduled for simulation for treatments to the area of recurrence along the left chest wall area. Patient called this morning and has decided not to proceed with radiation therapy at this time. She will continue on hormonal therapy thru Dr. Gustavo Lah office. -----------------------------------  Billie Lade, PhD, MD

## 2013-04-24 ENCOUNTER — Other Ambulatory Visit: Payer: Self-pay | Admitting: Hematology & Oncology

## 2013-05-06 ENCOUNTER — Other Ambulatory Visit (HOSPITAL_BASED_OUTPATIENT_CLINIC_OR_DEPARTMENT_OTHER): Payer: Medicare Other | Admitting: Lab

## 2013-05-06 ENCOUNTER — Encounter: Payer: Self-pay | Admitting: Hematology & Oncology

## 2013-05-06 ENCOUNTER — Ambulatory Visit (HOSPITAL_BASED_OUTPATIENT_CLINIC_OR_DEPARTMENT_OTHER): Payer: Medicare Other | Admitting: Hematology & Oncology

## 2013-05-06 VITALS — BP 98/59 | HR 97 | Temp 98.2°F | Resp 14 | Ht 67.0 in | Wt 183.0 lb

## 2013-05-06 DIAGNOSIS — C50919 Malignant neoplasm of unspecified site of unspecified female breast: Secondary | ICD-10-CM

## 2013-05-06 DIAGNOSIS — C50912 Malignant neoplasm of unspecified site of left female breast: Secondary | ICD-10-CM

## 2013-05-06 LAB — CBC WITH DIFFERENTIAL (CANCER CENTER ONLY)
BASO#: 0 10*3/uL (ref 0.0–0.2)
BASO%: 0.4 % (ref 0.0–2.0)
EOS ABS: 0.1 10*3/uL (ref 0.0–0.5)
EOS%: 1.5 % (ref 0.0–7.0)
HCT: 44.7 % (ref 34.8–46.6)
HEMOGLOBIN: 14.2 g/dL (ref 11.6–15.9)
LYMPH#: 1.1 10*3/uL (ref 0.9–3.3)
LYMPH%: 21.2 % (ref 14.0–48.0)
MCH: 29.1 pg (ref 26.0–34.0)
MCHC: 31.8 g/dL — ABNORMAL LOW (ref 32.0–36.0)
MCV: 92 fL (ref 81–101)
MONO#: 0.4 10*3/uL (ref 0.1–0.9)
MONO%: 8 % (ref 0.0–13.0)
NEUT%: 68.9 % (ref 39.6–80.0)
NEUTROS ABS: 3.6 10*3/uL (ref 1.5–6.5)
Platelets: 195 10*3/uL (ref 145–400)
RBC: 4.88 10*6/uL (ref 3.70–5.32)
RDW: 14.6 % (ref 11.1–15.7)
WBC: 5.3 10*3/uL (ref 3.9–10.0)

## 2013-05-06 LAB — COMPREHENSIVE METABOLIC PANEL
ALK PHOS: 63 U/L (ref 39–117)
ALT: 10 U/L (ref 0–35)
AST: 19 U/L (ref 0–37)
Albumin: 3.8 g/dL (ref 3.5–5.2)
BILIRUBIN TOTAL: 0.8 mg/dL (ref 0.3–1.2)
BUN: 12 mg/dL (ref 6–23)
CO2: 29 mEq/L (ref 19–32)
Calcium: 9.3 mg/dL (ref 8.4–10.5)
Chloride: 103 mEq/L (ref 96–112)
Creatinine, Ser: 1.15 mg/dL — ABNORMAL HIGH (ref 0.50–1.10)
GLUCOSE: 74 mg/dL (ref 70–99)
Potassium: 4.5 mEq/L (ref 3.5–5.3)
Sodium: 141 mEq/L (ref 135–145)
Total Protein: 6.5 g/dL (ref 6.0–8.3)

## 2013-05-06 NOTE — Progress Notes (Signed)
This office note has been dictated.

## 2013-05-07 NOTE — Progress Notes (Signed)
CC:   Erin Low, MD  DIAGNOSES: 1. Metastatic breast cancer - left chest wall recurrence. 2. 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  CURRENT THERAPY:  Aromasin 25 mg p.o. daily.  INTERIM HISTORY:  Erin Beasley comes in for followup.  Unfortunately, she could not tolerate radiation therapy.  She cannot do the setup for it.  She tried and she just cannot do it.  As such, we are just going with the Aromasin.  She looks pretty good.  She and her family enjoyed the Christmas and [place] holiday.  She has had no nausea or vomiting.  There has been no bleeding.  She is on Coumadin.  She has had no rashes.  She has had no nausea or vomiting.  There has been no change in bowel or bladder habits.  Her last CA 27.29 back in early December was 88.  PHYSICAL EXAMINATION:  General:  This is an elderly, but well-nourished [place] American female, in no obvious distress.  Vital Signs: Temperature of 98.2, pulse 97, respiratory rate 14, blood pressure 98/59, weight is 183 pounds.  Head and Neck:  Normocephalic, atraumatic skull.  There are no ocular or oral lesions.  There are no palpable cervical or supraclavicular lymph nodes.  Lungs:  Clear bilaterally. Cardiac:  Regular rate and rhythm, consistent with atrial fibrillation. She has a 1/6 systolic ejection murmur.  Abdomen:  Soft.  She has good bowel sounds.  There is no fluid wave.  There is no palpable abdominal mass.  There is no palpable hepatosplenomegaly.  Extremities:  No clubbing, cyanosis, or edema.  She does have weakness in the right arm secondary to the CVA.  Right leg strength is not too bad.  Skin:  Left chest wall recurrence.  This looks a little bit less inflamed and erythematous as compared to before.  This area measures about 2 x 2 cm. There is no obvious axillary lymphadenopathy.  LABORATORY STUDIES:  White cell count is 5.3, hemoglobin 14, hematocrit 45, platelet count 195.  IMPRESSION:  [name] is a very nice 80 year old African American female.  She has metastatic breast cancer with recurrence on the left chest wall.  She is on Aromasin right now.  I think the Aromasin is holding this area stable.  We will go ahead and plan to get her back in another probably six weeks.  I will plan for another PET scan on her in March.    ______________________________ Volanda Napoleon, M.D. PRE/MEDQ  D:  05/06/2013  T:  05/07/2013  Job:  [id number]

## 2013-06-04 ENCOUNTER — Telehealth: Payer: Self-pay | Admitting: Hematology & Oncology

## 2013-06-04 NOTE — Telephone Encounter (Signed)
Pt moved 2-19 to 2-23

## 2013-06-05 ENCOUNTER — Other Ambulatory Visit: Payer: Medicare Other | Admitting: Lab

## 2013-06-05 ENCOUNTER — Ambulatory Visit: Payer: Medicare Other | Admitting: Hematology & Oncology

## 2013-06-09 ENCOUNTER — Ambulatory Visit (HOSPITAL_BASED_OUTPATIENT_CLINIC_OR_DEPARTMENT_OTHER): Payer: Medicare Other

## 2013-06-09 ENCOUNTER — Ambulatory Visit (HOSPITAL_BASED_OUTPATIENT_CLINIC_OR_DEPARTMENT_OTHER): Payer: Medicare Other | Admitting: Hematology & Oncology

## 2013-06-09 ENCOUNTER — Other Ambulatory Visit (HOSPITAL_BASED_OUTPATIENT_CLINIC_OR_DEPARTMENT_OTHER): Payer: Medicare Other | Admitting: Lab

## 2013-06-09 ENCOUNTER — Encounter: Payer: Self-pay | Admitting: Hematology & Oncology

## 2013-06-09 VITALS — BP 100/73 | HR 140 | Temp 97.9°F | Resp 16 | Ht 67.0 in | Wt 182.0 lb

## 2013-06-09 DIAGNOSIS — C50919 Malignant neoplasm of unspecified site of unspecified female breast: Secondary | ICD-10-CM

## 2013-06-09 DIAGNOSIS — Z8673 Personal history of transient ischemic attack (TIA), and cerebral infarction without residual deficits: Secondary | ICD-10-CM

## 2013-06-09 DIAGNOSIS — C779 Secondary and unspecified malignant neoplasm of lymph node, unspecified: Secondary | ICD-10-CM

## 2013-06-09 DIAGNOSIS — Z5111 Encounter for antineoplastic chemotherapy: Secondary | ICD-10-CM

## 2013-06-09 LAB — COMPREHENSIVE METABOLIC PANEL
ALK PHOS: 70 U/L (ref 39–117)
ALT: 9 U/L (ref 0–35)
AST: 16 U/L (ref 0–37)
Albumin: 3.9 g/dL (ref 3.5–5.2)
BILIRUBIN TOTAL: 0.7 mg/dL (ref 0.2–1.2)
BUN: 17 mg/dL (ref 6–23)
CO2: 28 meq/L (ref 19–32)
Calcium: 9.2 mg/dL (ref 8.4–10.5)
Chloride: 105 mEq/L (ref 96–112)
Creatinine, Ser: 1.2 mg/dL — ABNORMAL HIGH (ref 0.50–1.10)
Glucose, Bld: 89 mg/dL (ref 70–99)
Potassium: 4.4 mEq/L (ref 3.5–5.3)
Sodium: 144 mEq/L (ref 135–145)
TOTAL PROTEIN: 6.8 g/dL (ref 6.0–8.3)

## 2013-06-09 LAB — CBC WITH DIFFERENTIAL (CANCER CENTER ONLY)
BASO#: 0 10*3/uL (ref 0.0–0.2)
BASO%: 0.2 % (ref 0.0–2.0)
EOS ABS: 0.1 10*3/uL (ref 0.0–0.5)
EOS%: 1.3 % (ref 0.0–7.0)
HEMATOCRIT: 46.4 % (ref 34.8–46.6)
HEMOGLOBIN: 14.8 g/dL (ref 11.6–15.9)
LYMPH#: 1.6 10*3/uL (ref 0.9–3.3)
LYMPH%: 25.6 % (ref 14.0–48.0)
MCH: 28.8 pg (ref 26.0–34.0)
MCHC: 31.9 g/dL — AB (ref 32.0–36.0)
MCV: 90 fL (ref 81–101)
MONO#: 0.5 10*3/uL (ref 0.1–0.9)
MONO%: 7.4 % (ref 0.0–13.0)
NEUT#: 4.1 10*3/uL (ref 1.5–6.5)
NEUT%: 65.5 % (ref 39.6–80.0)
Platelets: 201 10*3/uL (ref 145–400)
RBC: 5.13 10*6/uL (ref 3.70–5.32)
RDW: 14.6 % (ref 11.1–15.7)
WBC: 6.2 10*3/uL (ref 3.9–10.0)

## 2013-06-09 LAB — LACTATE DEHYDROGENASE: LDH: 172 U/L (ref 94–250)

## 2013-06-09 LAB — CANCER ANTIGEN 27.29: CA 27.29: 40 U/mL — ABNORMAL HIGH (ref 0–39)

## 2013-06-09 MED ORDER — FULVESTRANT 250 MG/5ML IM SOLN
500.0000 mg | INTRAMUSCULAR | Status: DC
  Administered 2013-06-09: 500 mg via INTRAMUSCULAR
  Filled 2013-06-09: qty 10

## 2013-06-09 MED ORDER — FULVESTRANT 250 MG/5ML IM SOLN
INTRAMUSCULAR | Status: AC
  Filled 2013-06-09: qty 10

## 2013-06-09 NOTE — Patient Instructions (Signed)
Fulvestrant injection What is this medicine? FULVESTRANT (ful VES trant) blocks the effects of estrogen. It is used to treat breast cancer in women past the age of menopause. This medicine may be used for other purposes; ask your health care provider or pharmacist if you have questions. COMMON BRAND NAME(S): FASLODEX What should I tell my health care provider before I take this medicine? They need to know if you have any of these conditions: -bleeding problems -liver disease -low levels of platelets in the blood -an unusual or allergic reaction to fulvestrant, other medicines, foods, dyes, or preservatives -pregnant or trying to get pregnant -breast-feeding How should I use this medicine? This medicine is for injection into a muscle. It is usually given by a health care professional in a hospital or clinic setting. Talk to your pediatrician regarding the use of this medicine in children. Special care may be needed. Overdosage: If you think you have taken too much of this medicine contact a poison control center or emergency room at once. NOTE: This medicine is only for you. Do not share this medicine with others. What if I miss a dose? It is important not to miss your dose. Call your doctor or health care professional if you are unable to keep an appointment. What may interact with this medicine? -medicines that treat or prevent blood clots like warfarin, enoxaparin, and dalteparin This list may not describe all possible interactions. Give your health care provider a list of all the medicines, herbs, non-prescription drugs, or dietary supplements you use. Also tell them if you smoke, drink alcohol, or use illegal drugs. Some items may interact with your medicine. What should I watch for while using this medicine? Your condition will be monitored carefully while you are receiving this medicine. You will need important blood work done while you are taking this medicine. Do not become pregnant  while taking this medicine. Women should inform their doctor if they wish to become pregnant or think they might be pregnant. There is a potential for serious side effects to an unborn child. Talk to your health care professional or pharmacist for more information. What side effects may I notice from receiving this medicine? Side effects that you should report to your doctor or health care professional as soon as possible: -allergic reactions like skin rash, itching or hives, swelling of the face, lips, or tongue -feeling faint or lightheaded, falls -fever or flu-like symptoms -sore throat -vaginal bleeding Side effects that usually do not require medical attention (report to your doctor or health care professional if they continue or are bothersome): -aches, pains -constipation or diarrhea -headache -hot flashes -nausea, vomiting -pain at site where injected -stomach pain This list may not describe all possible side effects. Call your doctor for medical advice about side effects. You may report side effects to FDA at 1-800-FDA-1088. Where should I keep my medicine? This drug is given in a hospital or clinic and will not be stored at home. NOTE: This sheet is a summary. It may not cover all possible information. If you have questions about this medicine, talk to your doctor, pharmacist, or health care provider.  2014, Elsevier/Gold Standard. (2007-08-12 15:39:24)  

## 2013-06-11 NOTE — Progress Notes (Signed)
Hematology and Oncology Follow Up Visit  Erin Beasley [id number] Sep 01, 1928 80 y.o. 06/11/2013   Principle Diagnosis:   Metastatic breast cancer-ER positive-left chest wall recurrence History of cerebrovascular accident with right-sided weakness Current Therapy:    Faslodex-500 mg-to start today     Interim History:  [name] is back for followup. She is an 80 year old. She has recurrent breast cancer. Her tumor is ER positive. We, Aromasin. Unfortunately, [place] in the left chest wall recurrence is progressing. We tried to a radiation therapy for her but she just cannot do this because of her CVA and she cannot lie down.  There's been no bleeding from the recurrence site. It clearly looks larger.  Otherwise, she feels well. She's had no pain. She's had a decent appetite. There's been no fever. She's had no weakness so that with her right side.  Is no nausea. Her appetite is good.  Overall, her performance status is ECoG 2-3  Medications: Current outpatient prescriptions:calcium carbonate (TUMS - DOSED IN MG ELEMENTAL CALCIUM) 500 MG chewable tablet, Chew 1 tablet by mouth daily., Disp: , Rfl: ;  Cholecalciferol (VITAMIN D PO), Take by mouth every morning. , Disp: , Rfl: ;  clotrimazole (LOTRIMIN) 1 % cream, Apply topically 2 (two) times daily., Disp: 60 g, Rfl: 4;  diltiazem (CARDIZEM CD) 120 MG 24 hr capsule, TAKE ONE CAPSULE EVERY DAY, Disp: 30 capsule, Rfl: 3 famotidine (PEPCID) 20 MG tablet, Take 20 mg by mouth daily. , Disp: , Rfl: ;  metoprolol (LOPRESSOR) 50 MG tablet, Take 50 mg by mouth 2 (two) times daily. , Disp: , Rfl: ;  simvastatin (ZOCOR) 20 MG tablet, Take 20 mg by mouth at bedtime. , Disp: , Rfl: ;  warfarin (COUMADIN) 5 MG tablet, Take 5 mg by mouth daily. DOES NOT TAKE ON M-W-F.  ONLY ON T-T-S-S, Disp: , Rfl:   Allergies: No Known Allergies  Past Medical History, Surgical history, Social history, and Family History were reviewed and updated.  Review of  Systems: As above  Physical Exam:  height is 5\' 7"  (1.702 m) and weight is 182 lb (82.555 kg). Her oral temperature is 97.9 F (36.6 C). Her blood pressure is 100/73 and her pulse is 140. Her respiration is 16.   Elderly Afro-American female  she is in no distress. Head and neck exam shows no adenopathy. No ocular or oral lesions are noted. Lungs are clear. Cardiac exam irregular rate and rhythm consistent with fibrillation. Abdomen is soft. No liver or spleen is noted. Chest wall exam shows the recurrence in the upper outer left chest wall. Is quite nodular. No bleeding is noted. It is nontender. No left axillary adenopathy is noted. Right breast unremarkable. Extremities shows some chronic muscle atrophy on the right leg. Skin exam no rashes. Neurological exam shows the right-sided weakness.           Lab Results  Component Value Date   WBC 6.2 06/09/2013   HGB 14.8 06/09/2013   HCT 46.4 06/09/2013   MCV 90 06/09/2013   PLT 201 06/09/2013     Chemistry      Component Value Date/Time   NA 144 06/09/2013 0850   NA 142 01/14/2013 0943   NA 141 01/12/2012 1332   K 4.4 06/09/2013 0850   K 4.7 01/14/2013 0943   K 4.5 01/12/2012 1332   CL 105 06/09/2013 0850   CL 99 01/14/2013 0943   CL 106 01/12/2012 1332   CO2 28 06/09/2013 0850  CO2 32 01/14/2013 0943   CO2 26 01/12/2012 1332   BUN 17 06/09/2013 0850   BUN 15 01/14/2013 0943   BUN 19.0 01/12/2012 1332   CREATININE 1.20* 06/09/2013 0850   CREATININE 1.0 01/14/2013 0943   CREATININE 1.1 01/12/2012 1332      Component Value Date/Time   CALCIUM 9.2 06/09/2013 0850   CALCIUM 9.7 01/14/2013 0943   CALCIUM 9.6 01/12/2012 1332   ALKPHOS 70 06/09/2013 0850   ALKPHOS 66 01/14/2013 0943   ALKPHOS 69 01/12/2012 1332   AST 16 06/09/2013 0850   AST 23 01/14/2013 0943   AST 25 01/12/2012 1332   ALT 9 06/09/2013 0850   ALT 18 01/14/2013 0943   ALT 18 01/12/2012 1332   BILITOT 0.7 06/09/2013 0850   BILITOT 1.00 01/14/2013 0943   BILITOT 0.60 01/12/2012 1332          Impression and Plan: Ms. Erin Beasley is an 80 year old African [place] female with metastatic breast cancer. She has a oligo- metastatic disease.  He clearly looks as if his chest wall recurrence is more prominent. As such, I think we need to be a little bit more aggressive with therapy. I do not want to see this area bleed or become infected.  I believe that we should try Faslodex. I think this is reasonable. I believe she would have no side effects with the Faslodex.  I spoke to her and her family. I explained why I felt we needed to make a change. Again she cannot take radiation which really would be the best way to help this chest wall recurrence.  We will give her a "loading" dose of Faslodex. Then get her on monthly Faslodex.  Her CA 27.29 is 40.  I would not do another PET scan probably for another couple months. We should be able to see if the Faslodex is working   I spent a good half hour or so with him today. Volanda Napoleon, MD 2/25/20152:47 PM

## 2013-06-23 ENCOUNTER — Ambulatory Visit (HOSPITAL_BASED_OUTPATIENT_CLINIC_OR_DEPARTMENT_OTHER): Payer: Medicare Other

## 2013-06-23 VITALS — BP 120/78 | HR 98 | Temp 97.2°F | Resp 16

## 2013-06-23 DIAGNOSIS — Z170 Estrogen receptor positive status [ER+]: Secondary | ICD-10-CM

## 2013-06-23 DIAGNOSIS — Z5111 Encounter for antineoplastic chemotherapy: Secondary | ICD-10-CM

## 2013-06-23 DIAGNOSIS — C773 Secondary and unspecified malignant neoplasm of axilla and upper limb lymph nodes: Secondary | ICD-10-CM

## 2013-06-23 DIAGNOSIS — C50919 Malignant neoplasm of unspecified site of unspecified female breast: Secondary | ICD-10-CM

## 2013-06-23 DIAGNOSIS — C792 Secondary malignant neoplasm of skin: Secondary | ICD-10-CM

## 2013-06-23 MED ORDER — FULVESTRANT 250 MG/5ML IM SOLN
500.0000 mg | INTRAMUSCULAR | Status: DC
  Administered 2013-06-23: 500 mg via INTRAMUSCULAR
  Filled 2013-06-23: qty 10

## 2013-06-23 MED ORDER — FULVESTRANT 250 MG/5ML IM SOLN
INTRAMUSCULAR | Status: AC
  Filled 2013-06-23: qty 10

## 2013-06-23 MED ORDER — FULVESTRANT 250 MG/5ML IM SOLN: 500.0000 mg | Freq: Once | INTRAMUSCULAR | Status: DC

## 2013-06-23 NOTE — Patient Instructions (Signed)
Fulvestrant injection What is this medicine? FULVESTRANT (ful VES trant) blocks the effects of estrogen. It is used to treat breast cancer in women past the age of menopause. This medicine may be used for other purposes; ask your health care provider or pharmacist if you have questions. COMMON BRAND NAME(S): FASLODEX What should I tell my health care provider before I take this medicine? They need to know if you have any of these conditions: -bleeding problems -liver disease -low levels of platelets in the blood -an unusual or allergic reaction to fulvestrant, other medicines, foods, dyes, or preservatives -pregnant or trying to get pregnant -breast-feeding How should I use this medicine? This medicine is for injection into a muscle. It is usually given by a health care professional in a hospital or clinic setting. Talk to your pediatrician regarding the use of this medicine in children. Special care may be needed. Overdosage: If you think you have taken too much of this medicine contact a poison control center or emergency room at once. NOTE: This medicine is only for you. Do not share this medicine with others. What if I miss a dose? It is important not to miss your dose. Call your doctor or health care professional if you are unable to keep an appointment. What may interact with this medicine? -medicines that treat or prevent blood clots like warfarin, enoxaparin, and dalteparin This list may not describe all possible interactions. Give your health care provider a list of all the medicines, herbs, non-prescription drugs, or dietary supplements you use. Also tell them if you smoke, drink alcohol, or use illegal drugs. Some items may interact with your medicine. What should I watch for while using this medicine? Your condition will be monitored carefully while you are receiving this medicine. You will need important blood work done while you are taking this medicine. Do not become pregnant  while taking this medicine. Women should inform their doctor if they wish to become pregnant or think they might be pregnant. There is a potential for serious side effects to an unborn child. Talk to your health care professional or pharmacist for more information. What side effects may I notice from receiving this medicine? Side effects that you should report to your doctor or health care professional as soon as possible: -allergic reactions like skin rash, itching or hives, swelling of the face, lips, or tongue -feeling faint or lightheaded, falls -fever or flu-like symptoms -sore throat -vaginal bleeding Side effects that usually do not require medical attention (report to your doctor or health care professional if they continue or are bothersome): -aches, pains -constipation or diarrhea -headache -hot flashes -nausea, vomiting -pain at site where injected -stomach pain This list may not describe all possible side effects. Call your doctor for medical advice about side effects. You may report side effects to FDA at 1-800-FDA-1088. Where should I keep my medicine? This drug is given in a hospital or clinic and will not be stored at home. NOTE: This sheet is a summary. It may not cover all possible information. If you have questions about this medicine, talk to your doctor, pharmacist, or health care provider.  2014, Elsevier/Gold Standard. (2007-08-12 15:39:24)  

## 2013-07-09 ENCOUNTER — Other Ambulatory Visit: Payer: Self-pay | Admitting: *Deleted

## 2013-07-09 DIAGNOSIS — C50919 Malignant neoplasm of unspecified site of unspecified female breast: Secondary | ICD-10-CM

## 2013-07-21 ENCOUNTER — Other Ambulatory Visit (HOSPITAL_BASED_OUTPATIENT_CLINIC_OR_DEPARTMENT_OTHER): Payer: Medicare Other | Admitting: Lab

## 2013-07-21 ENCOUNTER — Ambulatory Visit (HOSPITAL_BASED_OUTPATIENT_CLINIC_OR_DEPARTMENT_OTHER): Payer: Medicare Other | Admitting: Hematology & Oncology

## 2013-07-21 ENCOUNTER — Ambulatory Visit (HOSPITAL_BASED_OUTPATIENT_CLINIC_OR_DEPARTMENT_OTHER): Payer: Medicare Other

## 2013-07-21 ENCOUNTER — Encounter: Payer: Self-pay | Admitting: Hematology & Oncology

## 2013-07-21 VITALS — BP 112/88 | HR 96 | Temp 97.6°F | Resp 14 | Ht 66.0 in | Wt 179.0 lb

## 2013-07-21 DIAGNOSIS — C50919 Malignant neoplasm of unspecified site of unspecified female breast: Secondary | ICD-10-CM

## 2013-07-21 DIAGNOSIS — C779 Secondary and unspecified malignant neoplasm of lymph node, unspecified: Secondary | ICD-10-CM

## 2013-07-21 DIAGNOSIS — Z5111 Encounter for antineoplastic chemotherapy: Secondary | ICD-10-CM

## 2013-07-21 DIAGNOSIS — Z86718 Personal history of other venous thrombosis and embolism: Secondary | ICD-10-CM

## 2013-07-21 DIAGNOSIS — Z170 Estrogen receptor positive status [ER+]: Secondary | ICD-10-CM

## 2013-07-21 LAB — CMP (CANCER CENTER ONLY)
ALT(SGPT): 15 U/L (ref 10–47)
AST: 21 U/L (ref 11–38)
Albumin: 3.5 g/dL (ref 3.3–5.5)
Alkaline Phosphatase: 57 U/L (ref 26–84)
BUN, Bld: 16 mg/dL (ref 7–22)
CALCIUM: 9.2 mg/dL (ref 8.0–10.3)
CO2: 31 meq/L (ref 18–33)
CREATININE: 1.2 mg/dL (ref 0.6–1.2)
Chloride: 106 mEq/L (ref 98–108)
GLUCOSE: 86 mg/dL (ref 73–118)
Potassium: 4.2 mEq/L (ref 3.3–4.7)
SODIUM: 146 meq/L — AB (ref 128–145)
TOTAL PROTEIN: 7.2 g/dL (ref 6.4–8.1)
Total Bilirubin: 0.9 mg/dl (ref 0.20–1.60)

## 2013-07-21 LAB — CBC WITH DIFFERENTIAL (CANCER CENTER ONLY)
BASO#: 0 10*3/uL (ref 0.0–0.2)
BASO%: 0.2 % (ref 0.0–2.0)
EOS%: 2.7 % (ref 0.0–7.0)
Eosinophils Absolute: 0.2 10*3/uL (ref 0.0–0.5)
HEMATOCRIT: 44.8 % (ref 34.8–46.6)
HGB: 14.7 g/dL (ref 11.6–15.9)
LYMPH#: 1.8 10*3/uL (ref 0.9–3.3)
LYMPH%: 30 % (ref 14.0–48.0)
MCH: 29.6 pg (ref 26.0–34.0)
MCHC: 32.8 g/dL (ref 32.0–36.0)
MCV: 90 fL (ref 81–101)
MONO#: 0.5 10*3/uL (ref 0.1–0.9)
MONO%: 8.5 % (ref 0.0–13.0)
NEUT#: 3.4 10*3/uL (ref 1.5–6.5)
NEUT%: 58.6 % (ref 39.6–80.0)
Platelets: 179 10*3/uL (ref 145–400)
RBC: 4.96 10*6/uL (ref 3.70–5.32)
RDW: 14.5 % (ref 11.1–15.7)
WBC: 5.9 10*3/uL (ref 3.9–10.0)

## 2013-07-21 LAB — PROTIME-INR (CHCC SATELLITE)
INR: 2.2 (ref 2.0–3.5)
Protime: 26.4 Seconds — ABNORMAL HIGH (ref 10.6–13.4)

## 2013-07-21 LAB — CANCER ANTIGEN 27.29: CA 27.29: 47 U/mL — AB (ref 0–39)

## 2013-07-21 MED ORDER — FULVESTRANT 250 MG/5ML IM SOLN
INTRAMUSCULAR | Status: AC
  Filled 2013-07-21: qty 10

## 2013-07-21 MED ORDER — FULVESTRANT 250 MG/5ML IM SOLN
500.0000 mg | Freq: Once | INTRAMUSCULAR | Status: AC
  Administered 2013-07-21: 500 mg via INTRAMUSCULAR
  Filled 2013-07-21: qty 10

## 2013-07-21 NOTE — Progress Notes (Signed)
Hematology and Oncology Follow Up Visit  Erin Beasley [id number] January 09, 1929 80 y.o. 07/21/2013   Principle Diagnosis:   Metastatic breast cancer-ER positive  CVA with residual right-sided weakness  Chronic atrial fibrillation  Current Therapy:    Faslodex 500 mg IM every month     Interim History:  [name] is back for followup. She is doing okay. We just started Faslodex back in February. She's done well with this.  She's had no problems with nausea vomiting. She's had no hot flashes. She's had no problems pain once. She's had no fever sweats or chills. There's been no issues with bleeding. She is on Coumadin. Patient had no change with a right-sided weakness.   Medications: Current outpatient prescriptions:calcium carbonate (TUMS - DOSED IN MG ELEMENTAL CALCIUM) 500 MG chewable tablet, Chew 1 tablet by mouth daily., Disp: , Rfl: ;  Cholecalciferol (VITAMIN D PO), Take by mouth every morning. , Disp: , Rfl: ;  clotrimazole (LOTRIMIN) 1 % cream, Apply topically 2 (two) times daily., Disp: 60 g, Rfl: 4;  diltiazem (CARDIZEM CD) 120 MG 24 hr capsule, TAKE ONE CAPSULE EVERY DAY, Disp: 30 capsule, Rfl: 3 famotidine (PEPCID) 20 MG tablet, Take 20 mg by mouth daily. , Disp: , Rfl: ;  metoprolol (LOPRESSOR) 50 MG tablet, Take 50 mg by mouth 2 (two) times daily. , Disp: , Rfl: ;  simvastatin (ZOCOR) 20 MG tablet, Take 20 mg by mouth at bedtime. , Disp: , Rfl: ;  warfarin (COUMADIN) 5 MG tablet, Take 5 mg by mouth daily. ONLY  TAKES ON T-T-S-S NO OTHER DAYS, Disp: , Rfl:   Allergies: No Known Allergies  Past Medical History, Surgical history, Social history, and Family History were reviewed and updated.  Review of Systems: As above  Physical Exam:  height is 5\' 6"  (1.676 m) and weight is 179 lb (81.194 kg). Her oral temperature is 97.6 F (36.4 C). Her blood pressure is 112/88 and her pulse is 96. Her respiration is 14.   Elderly [place] American female. She has a right-sided weakness.  On her chest wall, she does have the left chest wall recurrence. It appears be a little bit less prominent. There may be less nodularity. There is no bleeding. There is no exudate. There is no adenopathy in the left axilla. Right breast is unremarkable. Lungs are clear. Cardiac exam irregular rate and rhythm consistent with atrial fibrillation. Abdomen soft. Has good bowel sounds. There is no fluid wave. There is no palpable abdominal mass. No palpable hepatosplenomegaly. Extremities shows weakness on the right side. Some slight edema noted in her legs bilaterally. Skin exam no rashes ecchymosis or petechia. Neurological exam shows the residual right-sided weakness.  Lab Results  Component Value Date   WBC 5.9 07/21/2013   HGB 14.7 07/21/2013   HCT 44.8 07/21/2013   MCV 90 07/21/2013   PLT 179 07/21/2013     Chemistry      Component Value Date/Time   NA 146* 07/21/2013 0804   NA 144 06/09/2013 0850   NA 141 01/12/2012 1332   K 4.2 07/21/2013 0804   K 4.4 06/09/2013 0850   K 4.5 01/12/2012 1332   CL 106 07/21/2013 0804   CL 105 06/09/2013 0850   CL 106 01/12/2012 1332   CO2 31 07/21/2013 0804   CO2 28 06/09/2013 0850   CO2 26 01/12/2012 1332   BUN 16 07/21/2013 0804   BUN 17 06/09/2013 0850   BUN 19.0 01/12/2012 1332   CREATININE  1.2 07/21/2013 0804   CREATININE 1.20* 06/09/2013 0850   CREATININE 1.1 01/12/2012 1332      Component Value Date/Time   CALCIUM 9.2 07/21/2013 0804   CALCIUM 9.2 06/09/2013 0850   CALCIUM 9.6 01/12/2012 1332   ALKPHOS 57 07/21/2013 0804   ALKPHOS 70 06/09/2013 0850   ALKPHOS 69 01/12/2012 1332   AST 21 07/21/2013 0804   AST 16 06/09/2013 0850   AST 25 01/12/2012 1332   ALT 15 07/21/2013 0804   ALT 9 06/09/2013 0850   ALT 18 01/12/2012 1332   BILITOT 0.90 07/21/2013 0804   BILITOT 0.7 06/09/2013 0850   BILITOT 0.60 01/12/2012 1332         Impression and Plan: Erin Beasley is a 57-year-old Afro-American female with chest wall recurrence of her breast cancer. She's had one cycle of Faslodex so  far. This was a loading type of cycle.  She will have her second cycle today.  I will get her  back in one month. We'll then do a third cycle. After the first cycle, we will then repeat her scans and see how things look.     Volanda Napoleon, MD 4/6/20158:47 AM

## 2013-07-21 NOTE — Patient Instructions (Signed)
Fulvestrant injection What is this medicine? FULVESTRANT (ful VES trant) blocks the effects of estrogen. It is used to treat breast cancer in women past the age of menopause. This medicine may be used for other purposes; ask your health care provider or pharmacist if you have questions. COMMON BRAND NAME(S): FASLODEX What should I tell my health care provider before I take this medicine? They need to know if you have any of these conditions: -bleeding problems -liver disease -low levels of platelets in the blood -an unusual or allergic reaction to fulvestrant, other medicines, foods, dyes, or preservatives -pregnant or trying to get pregnant -breast-feeding How should I use this medicine? This medicine is for injection into a muscle. It is usually given by a health care professional in a hospital or clinic setting. Talk to your pediatrician regarding the use of this medicine in children. Special care may be needed. Overdosage: If you think you have taken too much of this medicine contact a poison control center or emergency room at once. NOTE: This medicine is only for you. Do not share this medicine with others. What if I miss a dose? It is important not to miss your dose. Call your doctor or health care professional if you are unable to keep an appointment. What may interact with this medicine? -medicines that treat or prevent blood clots like warfarin, enoxaparin, and dalteparin This list may not describe all possible interactions. Give your health care provider a list of all the medicines, herbs, non-prescription drugs, or dietary supplements you use. Also tell them if you smoke, drink alcohol, or use illegal drugs. Some items may interact with your medicine. What should I watch for while using this medicine? Your condition will be monitored carefully while you are receiving this medicine. You will need important blood work done while you are taking this medicine. Do not become pregnant  while taking this medicine. Women should inform their doctor if they wish to become pregnant or think they might be pregnant. There is a potential for serious side effects to an unborn child. Talk to your health care professional or pharmacist for more information. What side effects may I notice from receiving this medicine? Side effects that you should report to your doctor or health care professional as soon as possible: -allergic reactions like skin rash, itching or hives, swelling of the face, lips, or tongue -feeling faint or lightheaded, falls -fever or flu-like symptoms -sore throat -vaginal bleeding Side effects that usually do not require medical attention (report to your doctor or health care professional if they continue or are bothersome): -aches, pains -constipation or diarrhea -headache -hot flashes -nausea, vomiting -pain at site where injected -stomach pain This list may not describe all possible side effects. Call your doctor for medical advice about side effects. You may report side effects to FDA at 1-800-FDA-1088. Where should I keep my medicine? This drug is given in a hospital or clinic and will not be stored at home. NOTE: This sheet is a summary. It may not cover all possible information. If you have questions about this medicine, talk to your doctor, pharmacist, or health care provider.  2014, Elsevier/Gold Standard. (2007-08-12 15:39:24)  

## 2013-07-30 ENCOUNTER — Other Ambulatory Visit: Payer: Self-pay | Admitting: Hematology & Oncology

## 2013-08-19 ENCOUNTER — Other Ambulatory Visit (HOSPITAL_BASED_OUTPATIENT_CLINIC_OR_DEPARTMENT_OTHER): Payer: Medicare Other | Admitting: Lab

## 2013-08-19 ENCOUNTER — Ambulatory Visit (HOSPITAL_BASED_OUTPATIENT_CLINIC_OR_DEPARTMENT_OTHER): Payer: Medicare Other

## 2013-08-19 ENCOUNTER — Telehealth: Payer: Self-pay | Admitting: *Deleted

## 2013-08-19 ENCOUNTER — Other Ambulatory Visit: Payer: Self-pay | Admitting: *Deleted

## 2013-08-19 ENCOUNTER — Ambulatory Visit (HOSPITAL_BASED_OUTPATIENT_CLINIC_OR_DEPARTMENT_OTHER): Payer: Medicare Other | Admitting: Hematology & Oncology

## 2013-08-19 ENCOUNTER — Encounter: Payer: Self-pay | Admitting: Hematology & Oncology

## 2013-08-19 VITALS — BP 108/64 | HR 68 | Temp 97.0°F | Resp 16 | Ht 66.0 in | Wt 178.0 lb

## 2013-08-19 DIAGNOSIS — I4891 Unspecified atrial fibrillation: Secondary | ICD-10-CM

## 2013-08-19 DIAGNOSIS — L03319 Cellulitis of trunk, unspecified: Secondary | ICD-10-CM

## 2013-08-19 DIAGNOSIS — T148XXA Other injury of unspecified body region, initial encounter: Principal | ICD-10-CM

## 2013-08-19 DIAGNOSIS — Z170 Estrogen receptor positive status [ER+]: Secondary | ICD-10-CM

## 2013-08-19 DIAGNOSIS — Z5111 Encounter for antineoplastic chemotherapy: Secondary | ICD-10-CM

## 2013-08-19 DIAGNOSIS — C50919 Malignant neoplasm of unspecified site of unspecified female breast: Secondary | ICD-10-CM

## 2013-08-19 DIAGNOSIS — Z8673 Personal history of transient ischemic attack (TIA), and cerebral infarction without residual deficits: Secondary | ICD-10-CM

## 2013-08-19 DIAGNOSIS — L089 Local infection of the skin and subcutaneous tissue, unspecified: Secondary | ICD-10-CM

## 2013-08-19 DIAGNOSIS — L02219 Cutaneous abscess of trunk, unspecified: Secondary | ICD-10-CM

## 2013-08-19 DIAGNOSIS — C779 Secondary and unspecified malignant neoplasm of lymph node, unspecified: Secondary | ICD-10-CM

## 2013-08-19 LAB — CMP (CANCER CENTER ONLY)
ALBUMIN: 3.7 g/dL (ref 3.3–5.5)
ALT(SGPT): 12 U/L (ref 10–47)
AST: 21 U/L (ref 11–38)
Alkaline Phosphatase: 70 U/L (ref 26–84)
BUN: 16 mg/dL (ref 7–22)
CHLORIDE: 104 meq/L (ref 98–108)
CO2: 32 mEq/L (ref 18–33)
Calcium: 9.8 mg/dL (ref 8.0–10.3)
Creat: 1.1 mg/dl (ref 0.6–1.2)
GLUCOSE: 89 mg/dL (ref 73–118)
POTASSIUM: 4 meq/L (ref 3.3–4.7)
Sodium: 149 mEq/L — ABNORMAL HIGH (ref 128–145)
Total Bilirubin: 0.9 mg/dl (ref 0.20–1.60)
Total Protein: 7.4 g/dL (ref 6.4–8.1)

## 2013-08-19 LAB — PROTIME-INR (CHCC SATELLITE)
INR: 2.3 (ref 2.0–3.5)
Protime: 27.6 Seconds — ABNORMAL HIGH (ref 10.6–13.4)

## 2013-08-19 LAB — CBC WITH DIFFERENTIAL (CANCER CENTER ONLY)
BASO#: 0 10*3/uL (ref 0.0–0.2)
BASO%: 0.4 % (ref 0.0–2.0)
EOS%: 1.5 % (ref 0.0–7.0)
Eosinophils Absolute: 0.1 10*3/uL (ref 0.0–0.5)
HEMATOCRIT: 44.1 % (ref 34.8–46.6)
HEMOGLOBIN: 14.3 g/dL (ref 11.6–15.9)
LYMPH#: 1.6 10*3/uL (ref 0.9–3.3)
LYMPH%: 29.1 % (ref 14.0–48.0)
MCH: 29.5 pg (ref 26.0–34.0)
MCHC: 32.4 g/dL (ref 32.0–36.0)
MCV: 91 fL (ref 81–101)
MONO#: 0.5 10*3/uL (ref 0.1–0.9)
MONO%: 8.6 % (ref 0.0–13.0)
NEUT#: 3.3 10*3/uL (ref 1.5–6.5)
NEUT%: 60.4 % (ref 39.6–80.0)
Platelets: 193 10*3/uL (ref 145–400)
RBC: 4.85 10*6/uL (ref 3.70–5.32)
RDW: 14.6 % (ref 11.1–15.7)
WBC: 5.4 10*3/uL (ref 3.9–10.0)

## 2013-08-19 LAB — CANCER ANTIGEN 27.29: CA 27.29: 53 U/mL — ABNORMAL HIGH (ref 0–39)

## 2013-08-19 MED ORDER — AMOXICILLIN-POT CLAVULANATE 875-125 MG PO TABS: 1.0000 | ORAL_TABLET | Freq: Two times a day (BID) | ORAL | Status: DC

## 2013-08-19 MED ORDER — FULVESTRANT 250 MG/5ML IM SOLN
500.0000 mg | Freq: Once | INTRAMUSCULAR | Status: AC
  Administered 2013-08-19: 500 mg via INTRAMUSCULAR
  Filled 2013-08-19: qty 10

## 2013-08-19 MED ORDER — SODIUM CHLORIDE 0.9 % IV SOLN
3.0000 g | Freq: Three times a day (TID) | INTRAVENOUS | Status: DC
  Filled 2013-08-19: qty 3

## 2013-08-19 MED ORDER — SODIUM CHLORIDE 0.9 % IV SOLN
3.0000 g | Freq: Once | INTRAVENOUS | Status: AC
  Administered 2013-08-19: 3 g via INTRAVENOUS
  Filled 2013-08-19: qty 3

## 2013-08-19 NOTE — Progress Notes (Signed)
08/19/2013 Pharmacy consulted to dose Unasyn for this 80 yo F with breast cancer. WT 80.7 kg,  WBC 5.4, creat 1.1, temp 97K.  Plan: Unasyn 3 gm IV q8h  Eudelia Bunch, Pharm.D. [phone] 08/19/2013 10:34 AM

## 2013-08-19 NOTE — Progress Notes (Signed)
Hematology and Oncology Follow Up Visit  Erin Beasley [id number] [birth year] 80 y.o. 08/19/2013   Principle Diagnosis:   Metastatic breast cancer-ER positive  CVA with residual right-sided weakness  Chronic atrial fibrillation  Current Therapy:   Faslodex 500 mg IM every month     Interim History:  [name] is back for followup. Unfortunately, she now has a problem with respect to an abscess on her back. Her husband noticed this a few days ago. It appears to have opened up a few days ago. He put a bandage on it.  She's had no fever. She's had no pain. Overall, she's done very nicely.  She's had no problems bleeding. She is on Coumadin.  She still has the weakness on her right side.  Her last tumor marker was stable with a CA 27-29 of 47. Medications: Current outpatient prescriptions:calcium carbonate (TUMS - DOSED IN MG ELEMENTAL CALCIUM) 500 MG chewable tablet, Chew 1 tablet by mouth daily., Disp: , Rfl: ;  Cholecalciferol (VITAMIN D PO), Take by mouth every morning. , Disp: , Rfl: ;  clotrimazole (LOTRIMIN) 1 % cream, Apply topically 2 (two) times daily., Disp: 60 g, Rfl: 4;  diltiazem (CARDIZEM CD) 120 MG 24 hr capsule, TAKE ONE CAPSULE EVERY DAY, Disp: 30 capsule, Rfl: 3 famotidine (PEPCID) 20 MG tablet, Take 20 mg by mouth daily. , Disp: , Rfl: ;  metoprolol (LOPRESSOR) 50 MG tablet, Take 50 mg by mouth 2 (two) times daily. , Disp: , Rfl: ;  simvastatin (ZOCOR) 20 MG tablet, Take 20 mg by mouth at bedtime. , Disp: , Rfl: ;  warfarin (COUMADIN) 5 MG tablet, Take 5 mg by mouth daily. ONLY  TAKES ON T-T-S-S NO OTHER DAYS, Disp: , Rfl:  amoxicillin-clavulanate (AUGMENTIN) 875-125 MG per tablet, Take 1 tablet by mouth 2 (two) times daily., Disp: 20 tablet, Rfl: 0  Allergies: No Known Allergies  Past Medical History, Surgical history, Social history, and Family History were reviewed and updated.  Review of Systems: As above  Physical Exam:  height is 5\' 6"  (1.676 m) and  weight is 178 lb (80.74 kg). Her oral temperature is 97 F (36.1 C). Her blood pressure is 108/64 and her pulse is 68. Her respiration is 16.   On her back, there is an abscess. This is in the left upper back. There is a lot of fluctuants. There is erythema around it. There is some open area. Purulent material came out. I cultured this. Lungs are clear. Cardiac exam regular rate and rhythm consistent with atrial fibrillation Left chest wall shows a area of recurrence. This appears to be a little bit less prominent. Right breast is unremarkable. Abdomen soft. No palpable liver or spleen. Extremities no clubbing cyanosis or edema. There is some right-sided weakness. Skin exam shows the abscess. Otherwise unremarkable. Neurological exam shows the right-sided weakness.  Lab Results  Component Value Date   WBC 5.4 08/19/2013   HGB 14.3 08/19/2013   HCT 44.1 08/19/2013   MCV 91 08/19/2013   PLT 193 08/19/2013     Chemistry      Component Value Date/Time   NA 149* 08/19/2013 0839   NA 144 06/09/2013 0850   NA 141 01/12/2012 1332   K 4.0 08/19/2013 0839   K 4.4 06/09/2013 0850   K 4.5 01/12/2012 1332   CL 104 08/19/2013 0839   CL 105 06/09/2013 0850   CL 106 01/12/2012 1332   CO2 32 08/19/2013 0839   CO2 28 06/09/2013  0850   CO2 26 01/12/2012 1332   BUN 16 08/19/2013 0839   BUN 17 06/09/2013 0850   BUN 19.0 01/12/2012 1332   CREATININE 1.1 08/19/2013 0839   CREATININE 1.20* 06/09/2013 0850   CREATININE 1.1 01/12/2012 1332      Component Value Date/Time   CALCIUM 9.8 08/19/2013 0839   CALCIUM 9.2 06/09/2013 0850   CALCIUM 9.6 01/12/2012 1332   ALKPHOS 70 08/19/2013 0839   ALKPHOS 70 06/09/2013 0850   ALKPHOS 69 01/12/2012 1332   AST 21 08/19/2013 0839   AST 16 06/09/2013 0850   AST 25 01/12/2012 1332   ALT 12 08/19/2013 0839   ALT 9 06/09/2013 0850   ALT 18 01/12/2012 1332   BILITOT 0.90 08/19/2013 0839   BILITOT 0.7 06/09/2013 0850   BILITOT 0.60 01/12/2012 1332         Impression and Plan: Erin Beasley is a 80 year old  Afro-American female. She has metastatic breast cancer. Her tumor is receptor positive. We have her on Faslodex.  I went ahead and try to open up the abscess a little bit more. More material came out. I did go ahead and give her a dose of IV antibiotics with Unasyn. I will put her on some oral antibiotics with Augmentin. Hopefully, we can avoid surgery but I suspect that she probably will need to have a surgical drainage performed.  I want her to come back tomorrow so we can see how the abscess looks.  We will get her set up with a PET scan in 4 weeks to see how she is doing with her breast cancer.  She is asymptomatic with breast cancer. We have to keep this in mind given her other health problems.  I spent about 45 minutes or so with her today. I was not expecting to have to deal with his abscess but I want to try to help her so she would not have to go anywhere else today. We did give her the antibiotics in the office.    Volanda Napoleon, MD 5/5/20151:55 PM

## 2013-08-19 NOTE — Telephone Encounter (Signed)
Referral sent to [facility] for assessment for patient in the home to see what services they may qualify for.

## 2013-08-19 NOTE — Patient Instructions (Signed)
Ampicillin; Sulbactam injection What is this medicine? AMPICILLIN; SULBACTAM (am pi SILL in; sul BAK tam) is a penicillin antibiotic. It is used to treat certain kinds of bacterial infections. It will not work for colds, flu, or other viral infections. This medicine may be used for other purposes; ask your health care provider or pharmacist if you have questions. COMMON BRAND NAME(S): Unasyn What should I tell my health care provider before I take this medicine? They need to know if you have any of these conditions: -heart disease -kidney disease -mononucleosis -an unusual or allergic reaction to ampicillin, other penicillins or antibiotics, foods, dyes, or preservatives -pregnant or trying to get pregnant -breast-feeding How should I use this medicine? This medicine is infused into a vein or injected deep into a muscle. It is usually given by a health care professional in a hospital or clinic setting. If you get this medicine at home, you will be taught how to prepare and give this medicine. Use exactly as directed. Take your medicine at regular intervals. Do not take your medicine more often than directed. It is important that you put your used needles and syringes in a special sharps container. Do not put them in a trash can. If you do not have a sharps container, call your pharmacist or healthcare provider to get one. Talk to your pediatrician regarding the use of this medicine in children. Special care may be needed. Overdosage: If you think you have taken too much of this medicine contact a poison control center or emergency room at once. NOTE: This medicine is only for you. Do not share this medicine with others. What if I miss a dose? If you miss a dose, take it as soon as you can. If it is almost time for your next dose, take only that dose. Do not take double or extra doses. What may interact with this medicine? -allopurinol -female hormones, including contraceptive or birth control  pills -probenecid -some other antibiotics given by injection This list may not describe all possible interactions. Give your health care provider a list of all the medicines, herbs, non-prescription drugs, or dietary supplements you use. Also tell them if you smoke, drink alcohol, or use illegal drugs. Some items may interact with your medicine. What should I watch for while using this medicine? Tell your doctor or health care professional if your symptoms do not improve or if you get new symptoms. Do not treat diarrhea with over the counter products. Contact your doctor if you have diarrhea that lasts more than 2 days or if the diarrhea is severe and watery. This medicine can interfere with some urine glucose tests. If you use such tests, talk with your health care professional. Birth control pills may not work properly while you are taking this medicine. Talk to your doctor about using an extra method of birth control. What side effects may I notice from receiving this medicine? Side effects that you should report to your doctor or health care professional as soon as possible: -allergic reactions like skin rash or hives, swelling of the face, lips, or tongue -chest pain -difficulty breathing -fever, chills -pain or difficulty passing urine -redness, blistering, peeling or loosening of the skin, including inside the mouth -seizures -unusual bleeding, bruising -unusually weak or tired Side effects that usually do not require medical attention (report to your doctor or health care professional if they continue or are bothersome): -diarrhea -headache -heartburn -nausea, vomiting -pain, irritation at the site of injection -sore mouth, tongue -  stomach gas This list may not describe all possible side effects. Call your doctor for medical advice about side effects. You may report side effects to FDA at 1-800-FDA-1088. Where should I keep my medicine? Keep out of the reach of children. You  will be instructed on how to store this medicine. Throw away any unused medicine after the expiration date on the label. NOTE: This sheet is a summary. It may not cover all possible information. If you have questions about this medicine, talk to your doctor, pharmacist, or health care provider.  2014, Elsevier/Gold Standard. (2007-06-25 14:37:17)

## 2013-08-20 ENCOUNTER — Encounter: Payer: Self-pay | Admitting: *Deleted

## 2013-08-20 NOTE — Progress Notes (Signed)
Patient received Unasyn IVPB yesterday in office for back wound.  Dr. Marin Olp prescribed Augmentin for patient.  Erasmo Downer (patients niece) hesitant to get Augmentin because Dr. Lysle Rubens and pharmacy says that patient is allergic to penicillin.  Called Elmyra Ricks in Dr. Glenna Durand office.  Patient documented to have rash once when taking penicillin.  Talked to patient after speaking with Dr. Marin Olp.  Patient will try Augmentin and at first sign of rash stop medicine and call our office.  Erasmo Downer ok with this.

## 2013-08-21 ENCOUNTER — Telehealth: Payer: Self-pay | Admitting: Hematology & Oncology

## 2013-08-21 LAB — WOUND CULTURE

## 2013-08-21 NOTE — Telephone Encounter (Signed)
Left message with Gatha Mayer to have pt call unable to reach.

## 2013-08-22 ENCOUNTER — Telehealth: Payer: Self-pay | Admitting: Hematology & Oncology

## 2013-08-22 ENCOUNTER — Telehealth: Payer: Self-pay | Admitting: *Deleted

## 2013-08-22 NOTE — Telephone Encounter (Signed)
Pt aware of 5-21 PET and to be NPO 6 hrs

## 2013-08-22 NOTE — Telephone Encounter (Signed)
Informed pt's daughter that a home health aide would be more appropriate to help with her mothers needs. Informed the daughter to contact an organization of her choice and get them to fax [place] information.

## 2013-09-04 ENCOUNTER — Encounter (HOSPITAL_COMMUNITY)
Admission: RE | Admit: 2013-09-04 | Discharge: 2013-09-04 | Disposition: A | Discharge status: Home / Self Care | Payer: Medicare Other | Source: Ambulatory Visit | Attending: Hematology & Oncology | Admitting: Hematology & Oncology

## 2013-09-04 ENCOUNTER — Encounter (HOSPITAL_COMMUNITY): Payer: Self-pay

## 2013-09-04 ENCOUNTER — Other Ambulatory Visit: Payer: Self-pay | Admitting: *Deleted

## 2013-09-04 DIAGNOSIS — C50919 Malignant neoplasm of unspecified site of unspecified female breast: Secondary | ICD-10-CM

## 2013-09-04 LAB — GLUCOSE, CAPILLARY: GLUCOSE-CAPILLARY: 84 mg/dL (ref 70–99)

## 2013-09-04 MED ORDER — FLUDEOXYGLUCOSE F - 18 (FDG) INJECTION
9.9000 | Freq: Once | INTRAVENOUS | Status: AC | PRN
  Administered 2013-09-04: 9.9 via INTRAVENOUS

## 2013-09-11 ENCOUNTER — Telehealth: Payer: Self-pay | Admitting: *Deleted

## 2013-09-11 NOTE — Telephone Encounter (Signed)
Message copied by Rico Ala on Thu Sep 11, 2013 12:52 PM ------      Message from: Burney Gauze R      Created: Wed Sep 10, 2013  6:10 PM       I called her daughter to mobile via PET scan for her mom. The PET scan shows a stable activity in the left chest wall lesion. There is slight increase in activity in size and a left supraclavicular lymph node. However, everything to me looks pretty stable. As such, I think we continue with what we're doing and just stay with our current protocol for her. Pete ------

## 2013-09-15 ENCOUNTER — Other Ambulatory Visit (HOSPITAL_BASED_OUTPATIENT_CLINIC_OR_DEPARTMENT_OTHER): Payer: Medicare Other | Admitting: Lab

## 2013-09-15 ENCOUNTER — Encounter: Payer: Self-pay | Admitting: Hematology & Oncology

## 2013-09-15 ENCOUNTER — Ambulatory Visit (HOSPITAL_BASED_OUTPATIENT_CLINIC_OR_DEPARTMENT_OTHER): Payer: Medicare Other

## 2013-09-15 ENCOUNTER — Ambulatory Visit (HOSPITAL_BASED_OUTPATIENT_CLINIC_OR_DEPARTMENT_OTHER): Payer: Medicare Other | Admitting: Hematology & Oncology

## 2013-09-15 VITALS — BP 101/69 | HR 86 | Temp 97.8°F | Resp 12 | Ht 66.0 in | Wt 176.0 lb

## 2013-09-15 DIAGNOSIS — Z5111 Encounter for antineoplastic chemotherapy: Secondary | ICD-10-CM

## 2013-09-15 DIAGNOSIS — C779 Secondary and unspecified malignant neoplasm of lymph node, unspecified: Secondary | ICD-10-CM

## 2013-09-15 DIAGNOSIS — C50919 Malignant neoplasm of unspecified site of unspecified female breast: Secondary | ICD-10-CM

## 2013-09-15 DIAGNOSIS — Z7901 Long term (current) use of anticoagulants: Secondary | ICD-10-CM

## 2013-09-15 DIAGNOSIS — L03319 Cellulitis of trunk, unspecified: Secondary | ICD-10-CM

## 2013-09-15 DIAGNOSIS — L02219 Cutaneous abscess of trunk, unspecified: Secondary | ICD-10-CM

## 2013-09-15 LAB — CMP (CANCER CENTER ONLY)
ALK PHOS: 60 U/L (ref 26–84)
ALT(SGPT): 14 U/L (ref 10–47)
AST: 25 U/L (ref 11–38)
Albumin: 3.4 g/dL (ref 3.3–5.5)
BUN, Bld: 16 mg/dL (ref 7–22)
CO2: 30 mEq/L (ref 18–33)
CREATININE: 1.1 mg/dL (ref 0.6–1.2)
Calcium: 9.2 mg/dL (ref 8.0–10.3)
Chloride: 105 mEq/L (ref 98–108)
Glucose, Bld: 84 mg/dL (ref 73–118)
POTASSIUM: 4.1 meq/L (ref 3.3–4.7)
Sodium: 144 mEq/L (ref 128–145)
Total Bilirubin: 0.7 mg/dl (ref 0.20–1.60)
Total Protein: 7 g/dL (ref 6.4–8.1)

## 2013-09-15 LAB — CBC WITH DIFFERENTIAL (CANCER CENTER ONLY)
BASO#: 0 10*3/uL (ref 0.0–0.2)
BASO%: 0.3 % (ref 0.0–2.0)
EOS ABS: 0.1 10*3/uL (ref 0.0–0.5)
EOS%: 1.5 % (ref 0.0–7.0)
HCT: 43.1 % (ref 34.8–46.6)
HEMOGLOBIN: 13.9 g/dL (ref 11.6–15.9)
LYMPH#: 1.6 10*3/uL (ref 0.9–3.3)
LYMPH%: 23.9 % (ref 14.0–48.0)
MCH: 29.3 pg (ref 26.0–34.0)
MCHC: 32.3 g/dL (ref 32.0–36.0)
MCV: 91 fL (ref 81–101)
MONO#: 0.5 10*3/uL (ref 0.1–0.9)
MONO%: 7.8 % (ref 0.0–13.0)
NEUT%: 66.5 % (ref 39.6–80.0)
NEUTROS ABS: 4.4 10*3/uL (ref 1.5–6.5)
Platelets: 184 10*3/uL (ref 145–400)
RBC: 4.74 10*6/uL (ref 3.70–5.32)
RDW: 14.9 % (ref 11.1–15.7)
WBC: 6.6 10*3/uL (ref 3.9–10.0)

## 2013-09-15 LAB — CANCER ANTIGEN 27.29: CA 27.29: 48 U/mL — AB (ref 0–39)

## 2013-09-15 LAB — PROTIME-INR (CHCC SATELLITE)
INR: 2.6 (ref 2.0–3.5)
Protime: 31.2 Seconds — ABNORMAL HIGH (ref 10.6–13.4)

## 2013-09-15 MED ORDER — FULVESTRANT 250 MG/5ML IM SOLN
INTRAMUSCULAR | Status: AC
  Filled 2013-09-15: qty 5

## 2013-09-15 MED ORDER — FULVESTRANT 250 MG/5ML IM SOLN
500.0000 mg | Freq: Once | INTRAMUSCULAR | Status: AC
  Administered 2013-09-15: 500 mg via INTRAMUSCULAR
  Filled 2013-09-15: qty 10

## 2013-09-15 NOTE — Patient Instructions (Signed)
Fulvestrant injection What is this medicine? FULVESTRANT (ful VES trant) blocks the effects of estrogen. It is used to treat breast cancer in women past the age of menopause. This medicine may be used for other purposes; ask your health care provider or pharmacist if you have questions. COMMON BRAND NAME(S): FASLODEX What should I tell my health care provider before I take this medicine? They need to know if you have any of these conditions: -bleeding problems -liver disease -low levels of platelets in the blood -an unusual or allergic reaction to fulvestrant, other medicines, foods, dyes, or preservatives -pregnant or trying to get pregnant -breast-feeding How should I use this medicine? This medicine is for injection into a muscle. It is usually given by a health care professional in a hospital or clinic setting. Talk to your pediatrician regarding the use of this medicine in children. Special care may be needed. Overdosage: If you think you have taken too much of this medicine contact a poison control center or emergency room at once. NOTE: This medicine is only for you. Do not share this medicine with others. What if I miss a dose? It is important not to miss your dose. Call your doctor or health care professional if you are unable to keep an appointment. What may interact with this medicine? -medicines that treat or prevent blood clots like warfarin, enoxaparin, and dalteparin This list may not describe all possible interactions. Give your health care provider a list of all the medicines, herbs, non-prescription drugs, or dietary supplements you use. Also tell them if you smoke, drink alcohol, or use illegal drugs. Some items may interact with your medicine. What should I watch for while using this medicine? Your condition will be monitored carefully while you are receiving this medicine. You will need important blood work done while you are taking this medicine. Do not become pregnant  while taking this medicine. Women should inform their doctor if they wish to become pregnant or think they might be pregnant. There is a potential for serious side effects to an unborn child. Talk to your health care professional or pharmacist for more information. What side effects may I notice from receiving this medicine? Side effects that you should report to your doctor or health care professional as soon as possible: -allergic reactions like skin rash, itching or hives, swelling of the face, lips, or tongue -feeling faint or lightheaded, falls -fever or flu-like symptoms -sore throat -vaginal bleeding Side effects that usually do not require medical attention (report to your doctor or health care professional if they continue or are bothersome): -aches, pains -constipation or diarrhea -headache -hot flashes -nausea, vomiting -pain at site where injected -stomach pain This list may not describe all possible side effects. Call your doctor for medical advice about side effects. You may report side effects to FDA at 1-800-FDA-1088. Where should I keep my medicine? This drug is given in a hospital or clinic and will not be stored at home. NOTE: This sheet is a summary. It may not cover all possible information. If you have questions about this medicine, talk to your doctor, pharmacist, or health care provider.  2014, Elsevier/Gold Standard. (2007-08-12 15:39:24)  

## 2013-09-16 ENCOUNTER — Telehealth: Payer: Self-pay | Admitting: Hematology & Oncology

## 2013-09-16 NOTE — Progress Notes (Signed)
Hematology and Oncology Follow Up Visit  Erin Beasley [id number] Sep 08, 1928 80 y.o. 09/16/2013   Principle Diagnosis:   Metastatic breast cancer-ER positive  CVA with residual right-sided weakness  Chronic atrial fibrillation  Current Therapy:   Faslodex 500 mg IM every month     Interim History:  [name] is back for followup. Shehad a problem with respect to an abscess on her back. Her husband noticed this a few days ago. It appears to have opened up a few days ago. He put a bandage on it.  She's had no fever. She's had no pain. Overall, she's done very nicely.  She's had no problems bleeding. She is on Coumadin.  She still has the weakness on her right side.  Her last tumor marker was stable with a CA 27-29 of 48  We did go ahead and repeat a PET scan on her.  This essentially showed stable disease.  The family is concerned about bills for the therapy.  I will have our insurance lady, Baxter Flattery, look into this.. Medications: Current outpatient prescriptions:calcium carbonate (TUMS - DOSED IN MG ELEMENTAL CALCIUM) 500 MG chewable tablet, Chew 1 tablet by mouth daily., Disp: , Rfl: ;  Cholecalciferol (VITAMIN D PO), Take by mouth every morning. , Disp: , Rfl: ;  diltiazem (CARDIZEM CD) 120 MG 24 hr capsule, TAKE ONE CAPSULE EVERY DAY, Disp: 30 capsule, Rfl: 3;  famotidine (PEPCID) 20 MG tablet, Take 20 mg by mouth daily. , Disp: , Rfl:  metoprolol (LOPRESSOR) 50 MG tablet, Take 50 mg by mouth 2 (two) times daily. , Disp: , Rfl: ;  simvastatin (ZOCOR) 20 MG tablet, Take 20 mg by mouth at bedtime. , Disp: , Rfl: ;  warfarin (COUMADIN) 5 MG tablet, Take 5 mg by mouth daily. ONLY  TAKES ON T-T-S-S NO OTHER DAYS, Disp: , Rfl:   Allergies: No Known Allergies  Past Medical History, Surgical history, Social history, and Family History were reviewed and updated.  Review of Systems: As above  Physical Exam:  height is 5\' 6"  (1.676 m) and weight is 176 lb (79.833 kg). Her oral  temperature is 97.8 F (36.6 C). Her blood pressure is 101/69 and her pulse is 86. Her respiration is 12.   On her back, there is an abscess. This is in the left upper back. There is a lot of fluctuants. There is erythema around it. There is some open area. Purulent material came out. I cultured this. Lungs are clear. Cardiac exam regular rate and rhythm consistent with atrial fibrillation Left chest wall shows a area of recurrence. This appears to be a little bit less prominent. Right breast is unremarkable. Abdomen soft. No palpable liver or spleen. Extremities no clubbing cyanosis or edema. There is some right-sided weakness. Skin exam shows the abscess that has healed up very nicely!!. Otherwise unremarkable. Neurological exam shows the right-sided weakness.  Lab Results  Component Value Date   WBC 6.6 09/15/2013   HGB 13.9 09/15/2013   HCT 43.1 09/15/2013   MCV 91 09/15/2013   PLT 184 09/15/2013     Chemistry      Component Value Date/Time   NA 144 09/15/2013 1124   NA 144 06/09/2013 0850   NA 141 01/12/2012 1332   K 4.1 09/15/2013 1124   K 4.4 06/09/2013 0850   K 4.5 01/12/2012 1332   CL 105 09/15/2013 1124   CL 105 06/09/2013 0850   CL 106 01/12/2012 1332   CO2 30 09/15/2013 1124  CO2 28 06/09/2013 0850   CO2 26 01/12/2012 1332   BUN 16 09/15/2013 1124   BUN 17 06/09/2013 0850   BUN 19.0 01/12/2012 1332   CREATININE 1.1 09/15/2013 1124   CREATININE 1.20* 06/09/2013 0850   CREATININE 1.1 01/12/2012 1332      Component Value Date/Time   CALCIUM 9.2 09/15/2013 1124   CALCIUM 9.2 06/09/2013 0850   CALCIUM 9.6 01/12/2012 1332   ALKPHOS 60 09/15/2013 1124   ALKPHOS 70 06/09/2013 0850   ALKPHOS 69 01/12/2012 1332   AST 25 09/15/2013 1124   AST 16 06/09/2013 0850   AST 25 01/12/2012 1332   ALT 14 09/15/2013 1124   ALT 9 06/09/2013 0850   ALT 18 01/12/2012 1332   BILITOT 0.70 09/15/2013 1124   BILITOT 0.7 06/09/2013 0850   BILITOT 0.60 01/12/2012 1332         Impression and Plan: Erin Beasley is a 80 year old  Afro-American female. She has metastatic breast cancer. Her tumor is receptor positive. We have her on Faslodex.  I went ahead and try to open up the abscess a little bit more. More material came out. I did go ahead and give her a dose of IV antibiotics with Unasyn. I did put her on some oral antibiotics with Augmentin.   The PET scan is re-assuring.  She is NOT symptomatic with the breast cancer.  She cannot do XRT to the LEFT chest wall mass.  ..  She is asymptomatic with breast cancer. We have to keep this in mind given her other health problems.  We will see her back in 4 weeks  Volanda Napoleon, MD 6/2/20156:20 AM

## 2013-09-16 NOTE — Telephone Encounter (Signed)
Pt's daughter Ivin Booty) called w financial and co-pay assistance concerns for Faslodex.   Sending pt's daughter information via mail on how to apply for assistance online below: [employer] Apply online by going to:   [email or web address]   [email or web address]   Faslodex Patient Savings Program - P: [phone]

## 2013-10-15 ENCOUNTER — Encounter: Payer: Self-pay | Admitting: Hematology & Oncology

## 2013-10-15 ENCOUNTER — Other Ambulatory Visit (HOSPITAL_BASED_OUTPATIENT_CLINIC_OR_DEPARTMENT_OTHER): Payer: Medicare Other | Admitting: Lab

## 2013-10-15 ENCOUNTER — Ambulatory Visit (HOSPITAL_BASED_OUTPATIENT_CLINIC_OR_DEPARTMENT_OTHER): Payer: Medicare Other | Admitting: Hematology & Oncology

## 2013-10-15 ENCOUNTER — Ambulatory Visit (HOSPITAL_BASED_OUTPATIENT_CLINIC_OR_DEPARTMENT_OTHER): Payer: Medicare Other

## 2013-10-15 VITALS — BP 92/62 | HR 88 | Temp 97.9°F | Resp 14 | Ht 66.0 in | Wt 175.0 lb

## 2013-10-15 DIAGNOSIS — C50912 Malignant neoplasm of unspecified site of left female breast: Secondary | ICD-10-CM

## 2013-10-15 DIAGNOSIS — I4891 Unspecified atrial fibrillation: Secondary | ICD-10-CM

## 2013-10-15 DIAGNOSIS — Z7901 Long term (current) use of anticoagulants: Secondary | ICD-10-CM

## 2013-10-15 DIAGNOSIS — Z5111 Encounter for antineoplastic chemotherapy: Secondary | ICD-10-CM

## 2013-10-15 DIAGNOSIS — C50919 Malignant neoplasm of unspecified site of unspecified female breast: Secondary | ICD-10-CM

## 2013-10-15 DIAGNOSIS — I69998 Other sequelae following unspecified cerebrovascular disease: Secondary | ICD-10-CM

## 2013-10-15 DIAGNOSIS — M6281 Muscle weakness (generalized): Secondary | ICD-10-CM

## 2013-10-15 DIAGNOSIS — Z170 Estrogen receptor positive status [ER+]: Secondary | ICD-10-CM

## 2013-10-15 LAB — CMP (CANCER CENTER ONLY)
ALBUMIN: 3.3 g/dL (ref 3.3–5.5)
ALK PHOS: 52 U/L (ref 26–84)
ALT(SGPT): 11 U/L (ref 10–47)
AST: 21 U/L (ref 11–38)
BUN: 17 mg/dL (ref 7–22)
CO2: 30 mEq/L (ref 18–33)
Calcium: 9 mg/dL (ref 8.0–10.3)
Chloride: 104 mEq/L (ref 98–108)
Creat: 1.1 mg/dl (ref 0.6–1.2)
Glucose, Bld: 83 mg/dL (ref 73–118)
POTASSIUM: 4.5 meq/L (ref 3.3–4.7)
Sodium: 143 mEq/L (ref 128–145)
Total Bilirubin: 0.6 mg/dl (ref 0.20–1.60)
Total Protein: 6.6 g/dL (ref 6.4–8.1)

## 2013-10-15 LAB — CBC WITH DIFFERENTIAL (CANCER CENTER ONLY)
BASO#: 0 10*3/uL (ref 0.0–0.2)
BASO%: 0.2 % (ref 0.0–2.0)
EOS ABS: 0.1 10*3/uL (ref 0.0–0.5)
EOS%: 1.9 % (ref 0.0–7.0)
HCT: 42.2 % (ref 34.8–46.6)
HGB: 13.8 g/dL (ref 11.6–15.9)
LYMPH#: 1.3 10*3/uL (ref 0.9–3.3)
LYMPH%: 24.8 % (ref 14.0–48.0)
MCH: 29.8 pg (ref 26.0–34.0)
MCHC: 32.7 g/dL (ref 32.0–36.0)
MCV: 91 fL (ref 81–101)
MONO#: 0.5 10*3/uL (ref 0.1–0.9)
MONO%: 9 % (ref 0.0–13.0)
NEUT%: 64.1 % (ref 39.6–80.0)
NEUTROS ABS: 3.3 10*3/uL (ref 1.5–6.5)
PLATELETS: 185 10*3/uL (ref 145–400)
RBC: 4.63 10*6/uL (ref 3.70–5.32)
RDW: 14.7 % (ref 11.1–15.7)
WBC: 5.2 10*3/uL (ref 3.9–10.0)

## 2013-10-15 LAB — PROTIME-INR (CHCC SATELLITE)
INR: 2.1 (ref 2.0–3.5)
PROTIME: 25.2 s — AB (ref 10.6–13.4)

## 2013-10-15 MED ORDER — FULVESTRANT 250 MG/5ML IM SOLN
INTRAMUSCULAR | Status: AC
  Filled 2013-10-15: qty 10

## 2013-10-15 MED ORDER — FULVESTRANT 250 MG/5ML IM SOLN
500.0000 mg | Freq: Once | INTRAMUSCULAR | Status: AC
  Administered 2013-10-15: 500 mg via INTRAMUSCULAR
  Filled 2013-10-15: qty 10

## 2013-10-15 NOTE — Progress Notes (Signed)
Hematology and Oncology Follow Up Visit  DELAILAH SPIETH [id number] July 27, 1928 80 y.o. 10/15/2013   Principle Diagnosis:   Metastatic breast cancer-ER positive  CVA with residual right-sided weakness  Chronic atrial fibrillation  Current Therapy:   Faslodex 500 mg IM every month     Interim History:  Ms.  Mcglinn is back for followup. She has been doing well. She's had no problems with nausea vomiting. There's been no pain. She had no bleeding. She is on Coumadin for chronic atrial fibrillation.  His been no problems with bowels or bladder. Patient still has a right-sided weakness which is stable.  Her daughter from [place] it came down to be with her. She was in the exam room away with resting this exam her.  Ms. Winfree CA 27.9 is 48.  She's had no cough. Does note shortness of breath.  She did have an abscess on her back this is all healed.   Medications: Current outpatient prescriptions:calcium carbonate (TUMS - DOSED IN MG ELEMENTAL CALCIUM) 500 MG chewable tablet, Chew 1 tablet by mouth daily., Disp: , Rfl: ;  Cholecalciferol (VITAMIN D PO), Take by mouth every morning. , Disp: , Rfl: ;  diltiazem (CARDIZEM CD) 120 MG 24 hr capsule, TAKE ONE CAPSULE EVERY DAY, Disp: 30 capsule, Rfl: 3;  famotidine (PEPCID) 20 MG tablet, Take 20 mg by mouth daily. , Disp: , Rfl:  metoprolol (LOPRESSOR) 50 MG tablet, Take 50 mg by mouth 2 (two) times daily. , Disp: , Rfl: ;  simvastatin (ZOCOR) 20 MG tablet, Take 20 mg by mouth at bedtime. , Disp: , Rfl: ;  warfarin (COUMADIN) 5 MG tablet, Take 5 mg by mouth daily. ONLY  TAKES ON T-T-S-S NO OTHER DAYS, Disp: , Rfl:   Allergies: No Known Allergies  Past Medical History, Surgical history, Social history, and Family History were reviewed and updated.  Review of Systems: As above  Physical Exam:  height is 5\' 6"  (1.676 m) and weight is 175 lb (79.379 kg). Her oral temperature is 97.9 F (36.6 C). Her blood pressure is 92/62 and her pulse is  88. Her respiration is 14.   On her back, there is an abscess. This is in the left upper back. There is a lot of fluctuants. There is erythema around it. There is some open area. Purulent material came out. I cultured this. Lungs are clear. Cardiac exam regular rate and rhythm consistent with atrial fibrillation Left chest wall shows a area of recurrence. This appears to be a little bit less prominent. Right breast is unremarkable. Abdomen soft. No palpable liver or spleen. Extremities no clubbing cyanosis or edema. There is some right-sided weakness. Skin exam shows the abscess that has healed up very nicely!!. Otherwise unremarkable. Neurological exam shows the right-sided weakness.  Lab Results  Component Value Date   WBC 5.2 10/15/2013   HGB 13.8 10/15/2013   HCT 42.2 10/15/2013   MCV 91 10/15/2013   PLT 185 10/15/2013     Chemistry      Component Value Date/Time   NA 143 10/15/2013 0943   NA 144 06/09/2013 0850   NA 141 01/12/2012 1332   K 4.5 10/15/2013 0943   K 4.4 06/09/2013 0850   K 4.5 01/12/2012 1332   CL 104 10/15/2013 0943   CL 105 06/09/2013 0850   CL 106 01/12/2012 1332   CO2 30 10/15/2013 0943   CO2 28 06/09/2013 0850   CO2 26 01/12/2012 1332   BUN 17 10/15/2013 0943  BUN 17 06/09/2013 0850   BUN 19.0 01/12/2012 1332   CREATININE 1.1 10/15/2013 0943   CREATININE 1.20* 06/09/2013 0850   CREATININE 1.1 01/12/2012 1332      Component Value Date/Time   CALCIUM 9.0 10/15/2013 0943   CALCIUM 9.2 06/09/2013 0850   CALCIUM 9.6 01/12/2012 1332   ALKPHOS 52 10/15/2013 0943   ALKPHOS 70 06/09/2013 0850   ALKPHOS 69 01/12/2012 1332   AST 21 10/15/2013 0943   AST 16 06/09/2013 0850   AST 25 01/12/2012 1332   ALT 11 10/15/2013 0943   ALT 9 06/09/2013 0850   ALT 18 01/12/2012 1332   BILITOT 0.60 10/15/2013 0943   BILITOT 0.7 06/09/2013 0850   BILITOT 0.60 01/12/2012 1332         Impression and Plan: Ms. Maudlin is a 80 year old Afro-American female. She has metastatic breast cancer. Her tumor is receptor  positive. We have her on Faslodex.  The area of tumor on the left chest wall might be a little more full. There is no bleeding. The CA 27.9 has gone up a little bit.  It is certainly possible that we might be having some decreased effectiveness of the Faslodex.  5 try to get radiation therapy for his tremor but she just is not going to do this.  Her last PET scan was done in May. I want to do another one in August.  I will go ahead and get her back in 4 weeks.  I am happy that her quality of life is doing fairly well.  Volanda Napoleon, MD 7/1/20151:10 PM

## 2013-10-15 NOTE — Patient Instructions (Signed)
Fulvestrant injection  What is this medicine?  FULVESTRANT (ful VES trant) blocks the effects of estrogen. It is used to treat breast cancer in women past the age of menopause.  This medicine may be used for other purposes; ask your health care provider or pharmacist if you have questions.  COMMON BRAND NAME(S): FASLODEX  What should I tell my health care provider before I take this medicine?  They need to know if you have any of these conditions:  -bleeding problems  -liver disease  -low levels of platelets in the blood  -an unusual or allergic reaction to fulvestrant, other medicines, foods, dyes, or preservatives  -pregnant or trying to get pregnant  -breast-feeding  How should I use this medicine?  This medicine is for injection into a muscle. It is usually given by a health care professional in a hospital or clinic setting.  Talk to your pediatrician regarding the use of this medicine in children. Special care may be needed.  Overdosage: If you think you have taken too much of this medicine contact a poison control center or emergency room at once.  NOTE: This medicine is only for you. Do not share this medicine with others.  What if I miss a dose?  It is important not to miss your dose. Call your doctor or health care professional if you are unable to keep an appointment.  What may interact with this medicine?  -medicines that treat or prevent blood clots like warfarin, enoxaparin, and dalteparin  This list may not describe all possible interactions. Give your health care provider a list of all the medicines, herbs, non-prescription drugs, or dietary supplements you use. Also tell them if you smoke, drink alcohol, or use illegal drugs. Some items may interact with your medicine.  What should I watch for while using this medicine?  Your condition will be monitored carefully while you are receiving this medicine. You will need important blood work done while you are taking this medicine.  Do not become pregnant  while taking this medicine. Women should inform their doctor if they wish to become pregnant or think they might be pregnant. There is a potential for serious side effects to an unborn child. Talk to your health care professional or pharmacist for more information.  What side effects may I notice from receiving this medicine?  Side effects that you should report to your doctor or health care professional as soon as possible:  -allergic reactions like skin rash, itching or hives, swelling of the face, lips, or tongue  -feeling faint or lightheaded, falls  -fever or flu-like symptoms  -sore throat  -vaginal bleeding  Side effects that usually do not require medical attention (report to your doctor or health care professional if they continue or are bothersome):  -aches, pains  -constipation or diarrhea  -headache  -hot flashes  -nausea, vomiting  -pain at site where injected  -stomach pain  This list may not describe all possible side effects. Call your doctor for medical advice about side effects. You may report side effects to FDA at 1-800-FDA-1088.  Where should I keep my medicine?  This drug is given in a hospital or clinic and will not be stored at home.  NOTE: This sheet is a summary. It may not cover all possible information. If you have questions about this medicine, talk to your doctor, pharmacist, or health care provider.   2015, Elsevier/Gold Standard. (2007-08-12 15:39:24)

## 2013-10-16 LAB — CANCER ANTIGEN 27.29: CA 27.29: 42 U/mL — AB (ref 0–39)

## 2013-11-11 ENCOUNTER — Other Ambulatory Visit (HOSPITAL_BASED_OUTPATIENT_CLINIC_OR_DEPARTMENT_OTHER): Payer: Medicare Other | Admitting: Lab

## 2013-11-11 ENCOUNTER — Ambulatory Visit (HOSPITAL_BASED_OUTPATIENT_CLINIC_OR_DEPARTMENT_OTHER): Payer: Medicare Other | Admitting: Hematology & Oncology

## 2013-11-11 ENCOUNTER — Encounter: Payer: Self-pay | Admitting: Hematology & Oncology

## 2013-11-11 ENCOUNTER — Ambulatory Visit (HOSPITAL_BASED_OUTPATIENT_CLINIC_OR_DEPARTMENT_OTHER): Payer: Medicare Other

## 2013-11-11 VITALS — BP 99/72 | HR 87 | Temp 96.9°F | Resp 18 | Wt 173.0 lb

## 2013-11-11 DIAGNOSIS — C50919 Malignant neoplasm of unspecified site of unspecified female breast: Secondary | ICD-10-CM

## 2013-11-11 DIAGNOSIS — Z170 Estrogen receptor positive status [ER+]: Secondary | ICD-10-CM

## 2013-11-11 DIAGNOSIS — E559 Vitamin D deficiency, unspecified: Secondary | ICD-10-CM

## 2013-11-11 DIAGNOSIS — C50912 Malignant neoplasm of unspecified site of left female breast: Secondary | ICD-10-CM

## 2013-11-11 DIAGNOSIS — Z5111 Encounter for antineoplastic chemotherapy: Secondary | ICD-10-CM

## 2013-11-11 LAB — CBC WITH DIFFERENTIAL (CANCER CENTER ONLY)
BASO#: 0 10*3/uL (ref 0.0–0.2)
BASO%: 0.2 % (ref 0.0–2.0)
EOS%: 1.6 % (ref 0.0–7.0)
Eosinophils Absolute: 0.1 10*3/uL (ref 0.0–0.5)
HCT: 41.9 % (ref 34.8–46.6)
HEMOGLOBIN: 13.7 g/dL (ref 11.6–15.9)
LYMPH#: 1.5 10*3/uL (ref 0.9–3.3)
LYMPH%: 26.4 % (ref 14.0–48.0)
MCH: 29.8 pg (ref 26.0–34.0)
MCHC: 32.7 g/dL (ref 32.0–36.0)
MCV: 91 fL (ref 81–101)
MONO#: 0.5 10*3/uL (ref 0.1–0.9)
MONO%: 7.9 % (ref 0.0–13.0)
NEUT%: 63.9 % (ref 39.6–80.0)
NEUTROS ABS: 3.7 10*3/uL (ref 1.5–6.5)
PLATELETS: 174 10*3/uL (ref 145–400)
RBC: 4.6 10*6/uL (ref 3.70–5.32)
RDW: 14.4 % (ref 11.1–15.7)
WBC: 5.7 10*3/uL (ref 3.9–10.0)

## 2013-11-11 LAB — CMP (CANCER CENTER ONLY)
ALBUMIN: 3.2 g/dL — AB (ref 3.3–5.5)
ALK PHOS: 47 U/L (ref 26–84)
ALT: 13 U/L (ref 10–47)
AST: 20 U/L (ref 11–38)
BILIRUBIN TOTAL: 0.7 mg/dL (ref 0.20–1.60)
BUN, Bld: 16 mg/dL (ref 7–22)
CO2: 31 meq/L (ref 18–33)
Calcium: 8.4 mg/dL (ref 8.0–10.3)
Chloride: 103 mEq/L (ref 98–108)
Creat: 1 mg/dl (ref 0.6–1.2)
Glucose, Bld: 85 mg/dL (ref 73–118)
Potassium: 4.5 mEq/L (ref 3.3–4.7)
SODIUM: 145 meq/L (ref 128–145)
Total Protein: 6.8 g/dL (ref 6.4–8.1)

## 2013-11-11 LAB — CANCER ANTIGEN 27.29: CA 27.29: 50 U/mL — AB (ref 0–39)

## 2013-11-11 LAB — LACTATE DEHYDROGENASE: LDH: 145 U/L (ref 94–250)

## 2013-11-11 MED ORDER — FULVESTRANT 250 MG/5ML IM SOLN
INTRAMUSCULAR | Status: AC
  Filled 2013-11-11: qty 10

## 2013-11-11 MED ORDER — FULVESTRANT 250 MG/5ML IM SOLN
500.0000 mg | Freq: Once | INTRAMUSCULAR | Status: AC
  Administered 2013-11-11: 500 mg via INTRAMUSCULAR
  Filled 2013-11-11: qty 10

## 2013-11-11 NOTE — Patient Instructions (Signed)
Palbociclib capsules  What is this medicine?  PALBOCICLIB (pal boe SYE klib) is a chemotherapy drug. It targets a specific protein within cancer cells and stops the cancer cells from growing. This medicine is used to treat breast cancer.  This medicine may be used for other purposes; ask your health care provider or pharmacist if you have questions.  COMMON BRAND NAME(S): Ibrance  What should I tell my health care provider before I take this medicine?  They need to know if you have any of these conditions:  -infection (especially a virus infection such as chickenpox, cold sores, or herpes)  -low blood counts, like low white cell, platelet, or red cell counts  -an unusual or allergic reaction to palbociclib, other medicines, foods, dyes, or preservatives  -pregnant or trying to get pregnant  -breast-feeding  How should I use this medicine?  Take this medicine by mouth with a glass of water. Follow the directions on the prescription label. Take this medicine with food. Avoid grapefruit and grapefruit juice while you are taking this medicine. Swallow the capsule whole. Do not cut, crush or chew this medicine. Take your medicine at regular intervals. Do not take it more often than directed. Do not stop taking except on your doctor's advice.  Talk to your pediatrician regarding the use of this medicine in children. Special care may be needed.  Overdosage: If you think you've taken too much of this medicine contact a poison control center or emergency room at once.  Overdosage: If you think you have taken too much of this medicine contact a poison control center or emergency room at once.  NOTE: This medicine is only for you. Do not share this medicine with others.  What if I miss a dose?  If you miss a dose or vomit after taking a dose, do not take another dose on that day. Take your next dose at your regular time.  What may interact with this medicine?  This medicine may interact with the following  medications:  -alfentanil  -antiviral medicines for HIV or AIDS  -boceprevir  -bosentan  -carbamazepine  -certain medicines for fungal infections like ketoconazole, itraconazole, posaconazole, voriconazole  -clarithromycin  -cyclosporine  -ergot alkaloids like dihydroergotamine, ergotamine  -everolimus  -fentanyl  -grapefruit juice  -midazolam  -modafinil  -nafcillin  -nefazodone  -phenobarbital  -phenytoin  -pimozide  -quinidine  -rifampin  -sirolimus  -St. John's Wort  -tacrolimus  -telaprevir  -telithromycin  -verapamil  This list may not describe all possible interactions. Give your health care provider a list of all the medicines, herbs, non-prescription drugs, or dietary supplements you use. Also tell them if you smoke, drink alcohol, or use illegal drugs. Some items may interact with your medicine.  What should I watch for while using this medicine?  Visit your doctor for regular check ups. Report any side effects. Continue your course of treatment unless your doctor tells you to stop. You will need blood work done while you are taking this medicine.  Do not become pregnant while taking this medicine or for 2 weeks after stopping it. Women should inform their doctor if they wish to become pregnant or think they might be pregnant. There is a potential for serious side effects to an unborn child. Talk to your health care professional or pharmacist for more information. Do not breast-feed an infant while taking this medicine.  Avoid taking products that contain aspirin, acetaminophen, ibuprofen, naproxen, or ketoprofen unless instructed by your doctor. These medicines may hide   a fever.  Be careful brushing and flossing your teeth or using a toothpick because you may get an infection or bleed more easily. If you have any dental work done, tell your dentist you are receiving this medicine.  Call your doctor or health care professional for advice if you get a fever, chills or sore throat, or other symptoms of a  cold or flu. Do not treat yourself. This drug decreases your body's ability to fight infections. Try to avoid being around people who are sick.  This medicine may increase your risk to bruise or bleed. Call your doctor or health care professional if you notice any unusual bleeding.  This drug may make you feel generally unwell. This is not uncommon, as chemotherapy can affect healthy cells as well as cancer cells. Report any side effects. Continue your course of treatment even though you feel ill unless your doctor tells you to stop.  This medicine may cause low sperm counts in some men. However, this medicine is not usually used in men. Talk to your health care professional or pharmacist for more information.  What side effects may I notice from receiving this medicine?  Side effects that you should report to your doctor or health care professional as soon as possible:  -allergic reactions like skin rash, itching or hives, swelling of the face, lips, or tongue  -dizziness  -mouth sores  -low blood counts - this medicine may decrease the number of white blood cells, red blood cells and platelets. You may be at increased risk for infections and bleeding.  -pain, tingling, numbness in the hands or feet  -severe or persistent diarrhea, nausea, vomiting  -signs and symptoms of a blood clot such as breathing problems; changes in vision; chest pain; severe, sudden headache; pain, swelling, warmth in the leg; trouble speaking; sudden numbness or weakness of the face, arm or leg  -signs and symptoms of infection like fever or chills; cough; sore throat; pain or trouble passing urine  -signs of decreased platelets or bleeding - nosebleed, bruising, pinpoint red spots on the skin, black, tarry stools, blood in the urine  -signs of decreased red blood cells - unusually weak or tired, feeling faint or lightheaded, falls  Side effects that usually do not require medical attention (Report these to your doctor or health care  professional if they continue or are bothersome.):  -decreased appetite  -hair thinning or hair loss  -mild diarrhea  -nausea  -weak or tired  This list may not describe all possible side effects. Call your doctor for medical advice about side effects. You may report side effects to FDA at 1-800-FDA-1088.  Where should I keep my medicine?  Keep out of the reach of children.  Store between 20 and 25 degrees C (68 and 77 degrees F). Throw away any unused medicine after the expiration date.  NOTE: This sheet is a summary. It may not cover all possible information. If you have questions about this medicine, talk to your doctor, pharmacist, or health care provider.  © 2015, [employer]. (2013-05-29 12:38:37)

## 2013-11-12 ENCOUNTER — Ambulatory Visit: Payer: Medicare Other

## 2013-11-12 NOTE — Progress Notes (Signed)
Hematology and Oncology Follow Up Visit  Erin Beasley [id number] July 19, 1928 80 y.o. 11/12/2013   Principle Diagnosis:   Metastatic breast cancer-ER positive  CVA with residual right-sided weakness  Chronic atrial fibrillation  Current Therapy:   Faslodex 500 mg IM every month     Interim History:  [name] is back for followup. She has been doing well. She's had no problems with nausea vomiting. There's been no pain. She had no bleeding. She is on Coumadin for chronic atrial fibrillation.  His been no problems with bowels or bladder. Patient still has right-sided weakness which is stable. Her appetite has been pretty good. She's had no cough. There's been no fever. She's had no arm or leg swelling.   Erin Beasley had a CA 27.29 which was 42 back in early July.  She did have an abscess on her back this is all healed.   Medications: Current outpatient prescriptions:calcium carbonate (TUMS - DOSED IN MG ELEMENTAL CALCIUM) 500 MG chewable tablet, Chew 1 tablet by mouth daily., Disp: , Rfl: ;  Cholecalciferol (VITAMIN D PO), Take by mouth every morning. , Disp: , Rfl: ;  diltiazem (CARDIZEM CD) 120 MG 24 hr capsule, TAKE ONE CAPSULE EVERY DAY, Disp: 30 capsule, Rfl: 3;  famotidine (PEPCID) 20 MG tablet, Take 20 mg by mouth daily. , Disp: , Rfl:  metoprolol (LOPRESSOR) 50 MG tablet, Take 50 mg by mouth 2 (two) times daily. , Disp: , Rfl: ;  simvastatin (ZOCOR) 20 MG tablet, Take 20 mg by mouth at bedtime. , Disp: , Rfl: ;  warfarin (COUMADIN) 5 MG tablet, Take 5 mg by mouth daily. ONLY  TAKES ON T-T-S-S NO OTHER DAYS, Disp: , Rfl:   Allergies: No Known Allergies  Past Medical History, Surgical history, Social history, and Family History were reviewed and updated.  Review of Systems: As above  Physical Exam:  weight is 173 lb (78.472 kg). Her temperature is 96.9 F (36.1 C). Her blood pressure is 99/72 and her pulse is 87. Her respiration is 18.   On her back, there is an  abscess. This is in the left upper back. There is a lot of fluctuants. There is erythema around it. There is some open area. Purulent material came out. I cultured this. Lungs are clear. Cardiac exam irregular rate and irregular rhythm consistent with atrial fibrillation Left chest wall shows a area of recurrence. This appears to be a little more prominent. There is no discharge. There is no bleeding. Right breast is unremarkable. Abdomen soft. No palpable liver or spleen. Extremities no clubbing cyanosis or edema. There is some right-sided weakness. Skin exam shows the abscess that has healed up very nicely!!. Otherwise unremarkable. Neurological exam shows the right-sided weakness.  Lab Results  Component Value Date   WBC 5.7 11/11/2013   HGB 13.7 11/11/2013   HCT 41.9 11/11/2013   MCV 91 11/11/2013   PLT 174 11/11/2013     Chemistry      Component Value Date/Time   NA 145 11/11/2013 1103   NA 144 06/09/2013 0850   NA 141 01/12/2012 1332   K 4.5 11/11/2013 1103   K 4.4 06/09/2013 0850   K 4.5 01/12/2012 1332   CL 103 11/11/2013 1103   CL 105 06/09/2013 0850   CL 106 01/12/2012 1332   CO2 31 11/11/2013 1103   CO2 28 06/09/2013 0850   CO2 26 01/12/2012 1332   BUN 16 11/11/2013 1103   BUN 17 06/09/2013 0850  BUN 19.0 01/12/2012 1332   CREATININE 1.0 11/11/2013 1103   CREATININE 1.20* 06/09/2013 0850   CREATININE 1.1 01/12/2012 1332      Component Value Date/Time   CALCIUM 8.4 11/11/2013 1103   CALCIUM 9.2 06/09/2013 0850   CALCIUM 9.6 01/12/2012 1332   ALKPHOS 47 11/11/2013 1103   ALKPHOS 70 06/09/2013 0850   ALKPHOS 69 01/12/2012 1332   AST 20 11/11/2013 1103   AST 16 06/09/2013 0850   AST 25 01/12/2012 1332   ALT 13 11/11/2013 1103   ALT 9 06/09/2013 0850   ALT 18 01/12/2012 1332   BILITOT 0.70 11/11/2013 1103   BILITOT 0.7 06/09/2013 0850   BILITOT 0.60 01/12/2012 1332     CA 27.29 is 50    Impression and Plan: Erin Beasley is a 80 year old Afro-American female. She has metastatic breast cancer.  Her tumor is receptor positive. We have her on Faslodex.  The area of tumor on the left chest wall clearly looks larger. There is no bleeding. The CA 27.9 has gone up a little bit. I talked to Ms. Bollig and her family. I explained to them and show them the tumor area. I really think that we need to get radiation therapy involved. It appeared that the last time we tried radiation, she did not want the markings.  I spoke with radiation oncology. Dr. Sondra Come said that they can probably get away without doing markings for her. As such, we will give her over there.  I think another option that we have is to add Ibrance to the Faslodex. This is oral. I don't see any interaction that it would have. I think she would tolerate it well. I believe this might to help [place] with any type of estrogen resistance.    Her last PET scan was done in May. I want to do another one in 3 months.  I will go ahead and get her back in 4 weeks.  Again, I really feel that radiation therapy to the left chest wall lesion will be beneficial.  I spent about 40 minutes with Erin Beasley and her family today explaining the change in her program and why we have to make the change.  I am happy that her quality of life is doing fairly well.  Volanda Napoleon, MD 7/29/20156:42 AM

## 2013-11-13 ENCOUNTER — Other Ambulatory Visit: Payer: Self-pay | Admitting: *Deleted

## 2013-11-13 DIAGNOSIS — C50912 Malignant neoplasm of unspecified site of left female breast: Secondary | ICD-10-CM

## 2013-11-19 NOTE — Progress Notes (Signed)
Location of Breast Cancer: left chest wall mass  Histology per Pathology Report: 02/09/2012   Skin , left chest wall, punch bx  O  METASTATIC ADENOCARCINOMA, CONSISTENT WITH DUCTAL CARCINOMA OF THE BREAST   Receptor Status: ER(100%), PR (negative), Her2-neu (negative)   Did patient present with symptoms (if so, please note symptoms) or was this found on screening mammography?: she noticed an abnormality on the lateral aspect of her left mastectomy scar.   Past/Anticipated interventions by surgeon, if any: past left mastectomy, skin biopsy left chest wall   Past/Anticipated interventions by medical oncology, if any: Arimidex for 8 years - stopped in 2013.  Patient now on Faslodex montly.   Lymphedema issues, if any:No   Pain issues, if any:no  SAFETY ISSUES:  Prior radiation? Saw Dr. Wu 01/2004  Pacemaker/ICD?No  Possible current pregnancy?No  Is the patient on methotrexate? no  Current Complaints / other details:Patient here with husband and daughter for consultation. Has right sided weakness from stroke in 1989 but is able to ambulate with minimal assistance. Denies pain today.  She has lost 11 lbs since April.   

## 2013-11-25 ENCOUNTER — Ambulatory Visit: Payer: Medicare Other | Admitting: Radiation Oncology

## 2013-11-25 ENCOUNTER — Ambulatory Visit
Admission: RE | Admit: 2013-11-25 | Discharge: 2013-11-25 | Disposition: A | Discharge status: Home / Self Care | Payer: Medicare Other | Source: Ambulatory Visit | Attending: Radiation Oncology | Admitting: Radiation Oncology

## 2013-11-25 ENCOUNTER — Encounter: Payer: Self-pay | Admitting: Radiation Oncology

## 2013-11-25 ENCOUNTER — Ambulatory Visit: Payer: Medicare Other

## 2013-11-25 VITALS — BP 109/76 | HR 85 | Temp 97.7°F | Ht 66.0 in | Wt 171.6 lb

## 2013-11-25 DIAGNOSIS — Z9010 Acquired absence of unspecified breast and nipple: Secondary | ICD-10-CM | POA: Diagnosis not present

## 2013-11-25 DIAGNOSIS — C50919 Malignant neoplasm of unspecified site of unspecified female breast: Secondary | ICD-10-CM

## 2013-11-25 DIAGNOSIS — C50912 Malignant neoplasm of unspecified site of left female breast: Secondary | ICD-10-CM

## 2013-11-25 DIAGNOSIS — I69998 Other sequelae following unspecified cerebrovascular disease: Secondary | ICD-10-CM | POA: Diagnosis not present

## 2013-11-25 DIAGNOSIS — R5381 Other malaise: Secondary | ICD-10-CM | POA: Diagnosis not present

## 2013-11-25 DIAGNOSIS — I4891 Unspecified atrial fibrillation: Secondary | ICD-10-CM | POA: Insufficient documentation

## 2013-11-25 DIAGNOSIS — Z510 Encounter for antineoplastic radiation therapy: Secondary | ICD-10-CM | POA: Diagnosis not present

## 2013-11-25 DIAGNOSIS — R5383 Other fatigue: Secondary | ICD-10-CM

## 2013-11-25 HISTORY — DX: Malignant neoplasm of unspecified site of left female breast: C50.912

## 2013-11-25 HISTORY — DX: Secondary malignant neoplasm of other specified sites: C79.89

## 2013-11-25 NOTE — Progress Notes (Signed)
Please see the Nurse Progress Note in the MD Initial Consult Encounter for this patient. 

## 2013-11-25 NOTE — Progress Notes (Signed)
Radiation Oncology         (336) [phone] ________________________________  Name: Erin Beasley MRN: [id number]  Date: 11/25/2013  DOB: [birth year]  Reevaluation Note  CC: Wenda Low, MD  Volanda Napoleon, MD  Diagnosis:   Recurrent left breast cancer   Narrative: The patient returns today for further evaluation at the courtesy of Dr. Burney Gauze. The patient was initially seen in consultation 03/26/2013. At that time the patient declined radiation treatments as part of her management. Patient has been on Faslodex 500 mg IM every month. On clinical exam by medical oncology the patient was noted to have progressive disease along the left lateral chest wall area. Patient is scheduled for PET scan early next week for further evaluation. Radiation therapy is been consulted for consideration for palliative treatment directed at the left chest wall mass.                           ALLERGIES:  has No Known Allergies.  Meds: Current Outpatient Prescriptions  Medication Sig Dispense Refill  . calcium carbonate (TUMS - DOSED IN MG ELEMENTAL CALCIUM) 500 MG chewable tablet Chew 1 tablet by mouth daily.      . Cholecalciferol (VITAMIN D PO) Take by mouth every morning.       . diltiazem (CARDIZEM CD) 120 MG 24 hr capsule TAKE ONE CAPSULE EVERY DAY  30 capsule  3  . famotidine (PEPCID) 20 MG tablet Take 20 mg by mouth daily.       . Fulvestrant (FASLODEX IM) Inject into the muscle every 30 (thirty) days.      . metoprolol (LOPRESSOR) 50 MG tablet Take 50 mg by mouth 2 (two) times daily.       . simvastatin (ZOCOR) 20 MG tablet Take 20 mg by mouth at bedtime.       Marland Kitchen warfarin (COUMADIN) 5 MG tablet Take 5 mg by mouth daily. ONLY  TAKES ON T-T-S-S NO OTHER DAYS       No current facility-administered medications for this encounter.    Physical Findings: The patient is in no acute distress. Patient is alert and oriented.  height is 5\' 6"  (1.676 m) and weight is 171 lb 9.6 oz (77.837 kg). Her  oral temperature is 97.7 F (36.5 C). Her blood pressure is 109/76 and her pulse is 85. .  The lungs are clear. The heart has an irregular rhythm consistent with atrial fibrillation. Patient has right-sided weakness from prior CVA. Examination of the left chest wall area reveals large mass along the lateral aspect of her mastectomy scar. This measures approximately 3-1/2 by approximately 5 cm. An area of induration surrounding this area measures proximally 6 x 7 cm. No drainage or signs of bleeding. Eschar is located on the center portion of the lesion. I am unable to palpate any left supraclavicular adenopathy.  Lab Findings: Lab Results  Component Value Date   WBC 5.7 11/11/2013   HGB 13.7 11/11/2013   HCT 41.9 11/11/2013   MCV 91 11/11/2013   PLT 174 11/11/2013     Radiographic Findings: No results found.  Impression:  Recurrent left breast cancer. Patient has a progressive mass in the lateral left chest wall area. She would be a candidate for palliative radiation therapy directed to this area. Patient is now willing to proceed with radiation treatments as part of her overall management.  Plan:  Simulation and planning early next week after her PET scan.  The  patient will likely be treated with electrons which will simplify her treatment and will avoid the necessity of tattoos which the patient has been against.  ____________________________________ Blair Promise, MD

## 2013-11-27 ENCOUNTER — Encounter: Payer: Self-pay | Admitting: *Deleted

## 2013-11-27 NOTE — Progress Notes (Signed)
[place] Psychosocial Distress Screening Clinical Social Work  Clinical Social Work was referred by distress screening protocol.  The patient scored a 5 on the Psychosocial Distress Thermometer which indicates moderate distress. Clinical Social Worker phoned pt to assess for distress and other psychosocial needs. CSW spoke to both pt and her husband over the phone; introduced CSW role, explained resources to assist at [facility].   They report to have a good support network of family that may help with transportation. Transportation is their biggest concern once radiation starts. CSW explained this CSW will be off next week, but that the CSW team will try to follow up once pt's appointments are set up to further assess needs. Pt shared she was "ready to get started", but overall was adjusting. Both were appreciative of call and plan to follow up as needed.   ONCBCN DISTRESS SCREENING 11/25/2013  Screening Type Initial Screening  Mark the number that describes how much distress you have been experiencing in the past week 5  Emotional problem type Adjusting to illness    Clinical Social Worker follow up needed: No.  If yes, follow up plan: See above.  Loren Racer, LCSW Clinical Social Worker [facility] [place] Wednesday, Thursday and Friday Phone: [phone] Fax: [phone]

## 2013-12-01 ENCOUNTER — Ambulatory Visit (HOSPITAL_COMMUNITY)
Admission: RE | Admit: 2013-12-01 | Discharge: 2013-12-01 | Disposition: A | Discharge status: Home / Self Care | Payer: Medicare Other | Source: Ambulatory Visit | Attending: Hematology & Oncology | Admitting: Hematology & Oncology

## 2013-12-01 DIAGNOSIS — I2510 Atherosclerotic heart disease of native coronary artery without angina pectoris: Secondary | ICD-10-CM | POA: Diagnosis not present

## 2013-12-01 DIAGNOSIS — I7090 Unspecified atherosclerosis: Secondary | ICD-10-CM | POA: Diagnosis not present

## 2013-12-01 DIAGNOSIS — K7689 Other specified diseases of liver: Secondary | ICD-10-CM | POA: Diagnosis not present

## 2013-12-01 DIAGNOSIS — C50919 Malignant neoplasm of unspecified site of unspecified female breast: Secondary | ICD-10-CM | POA: Insufficient documentation

## 2013-12-01 DIAGNOSIS — C773 Secondary and unspecified malignant neoplasm of axilla and upper limb lymph nodes: Secondary | ICD-10-CM | POA: Insufficient documentation

## 2013-12-01 DIAGNOSIS — C50912 Malignant neoplasm of unspecified site of left female breast: Secondary | ICD-10-CM

## 2013-12-01 LAB — GLUCOSE, CAPILLARY: GLUCOSE-CAPILLARY: 86 mg/dL (ref 70–99)

## 2013-12-01 MED ORDER — FLUDEOXYGLUCOSE F - 18 (FDG) INJECTION: 8.6000 | Freq: Once | INTRAVENOUS | Status: AC | PRN

## 2013-12-02 ENCOUNTER — Ambulatory Visit
Admission: RE | Admit: 2013-12-02 | Discharge: 2013-12-02 | Disposition: A | Discharge status: Home / Self Care | Payer: Medicare Other | Source: Ambulatory Visit | Attending: Radiation Oncology | Admitting: Radiation Oncology

## 2013-12-02 DIAGNOSIS — Z510 Encounter for antineoplastic radiation therapy: Secondary | ICD-10-CM | POA: Diagnosis not present

## 2013-12-02 DIAGNOSIS — C50912 Malignant neoplasm of unspecified site of left female breast: Secondary | ICD-10-CM

## 2013-12-02 NOTE — Progress Notes (Signed)
  Radiation Oncology         (336) [phone] ________________________________  Name: Erin Beasley MRN: [id number]  Date: 12/02/2013  DOB: 1928-06-17  SIMULATION AND TREATMENT PLANNING NOTE  DIAGNOSIS:  Recurrent left breast cancer  NARRATIVE:  The patient was brought to the [facility].  Identity was confirmed.  All relevant records and images related to the planned course of therapy were reviewed.  The patient freely provided informed written consent to proceed with treatment after reviewing the details related to the planned course of therapy. The consent form was witnessed and verified by the simulation staff.  Then, the patient was set-up in a stable reproducible  supine position for radiation therapy.  CT images were obtained.  Surface markings were placed.  The CT images were loaded into the planning software.  Then the target and avoidance structures were contoured.  Treatment planning then occurred.  The radiation prescription was entered and confirmed.  Then, I designed and supervised the construction of a total of 2 medically necessary complex treatment devices.  I have requested : Isodose Plan.  I have ordered:dose calc.  PLAN:  The patient will receive 30 Gy in 10 fractions. The patient will be treated with a custom electron cutout field based on outlining today during CT simulation. Electron energy will be determined after reviewing the patient's treatment planning CT scan to determine the depth of recurrence.  ________________________________  -----------------------------------  Blair Promise, PhD, MD

## 2013-12-05 DIAGNOSIS — Z510 Encounter for antineoplastic radiation therapy: Secondary | ICD-10-CM | POA: Diagnosis not present

## 2013-12-09 ENCOUNTER — Other Ambulatory Visit (HOSPITAL_BASED_OUTPATIENT_CLINIC_OR_DEPARTMENT_OTHER): Payer: Medicare Other | Admitting: Lab

## 2013-12-09 ENCOUNTER — Ambulatory Visit (HOSPITAL_BASED_OUTPATIENT_CLINIC_OR_DEPARTMENT_OTHER): Payer: Medicare Other

## 2013-12-09 ENCOUNTER — Ambulatory Visit (HOSPITAL_BASED_OUTPATIENT_CLINIC_OR_DEPARTMENT_OTHER): Payer: Medicare Other | Admitting: Hematology & Oncology

## 2013-12-09 VITALS — BP 101/63 | HR 97 | Temp 97.7°F | Resp 20 | Ht 66.0 in | Wt 172.0 lb

## 2013-12-09 DIAGNOSIS — Z5111 Encounter for antineoplastic chemotherapy: Secondary | ICD-10-CM

## 2013-12-09 DIAGNOSIS — C50919 Malignant neoplasm of unspecified site of unspecified female breast: Secondary | ICD-10-CM

## 2013-12-09 DIAGNOSIS — E559 Vitamin D deficiency, unspecified: Secondary | ICD-10-CM

## 2013-12-09 DIAGNOSIS — C50912 Malignant neoplasm of unspecified site of left female breast: Secondary | ICD-10-CM

## 2013-12-09 LAB — CMP (CANCER CENTER ONLY)
ALBUMIN: 3.1 g/dL — AB (ref 3.3–5.5)
ALK PHOS: 40 U/L (ref 26–84)
ALT: 10 U/L (ref 10–47)
AST: 19 U/L (ref 11–38)
BUN: 11 mg/dL (ref 7–22)
CO2: 27 mEq/L (ref 18–33)
Calcium: 8.6 mg/dL (ref 8.0–10.3)
Chloride: 103 mEq/L (ref 98–108)
Creat: 1.1 mg/dl (ref 0.6–1.2)
Glucose, Bld: 96 mg/dL (ref 73–118)
POTASSIUM: 4.2 meq/L (ref 3.3–4.7)
Sodium: 142 mEq/L (ref 128–145)
Total Bilirubin: 0.7 mg/dl (ref 0.20–1.60)
Total Protein: 6.8 g/dL (ref 6.4–8.1)

## 2013-12-09 LAB — CBC WITH DIFFERENTIAL (CANCER CENTER ONLY)
BASO#: 0 10*3/uL (ref 0.0–0.2)
BASO%: 0.4 % (ref 0.0–2.0)
EOS%: 1.1 % (ref 0.0–7.0)
Eosinophils Absolute: 0.1 10*3/uL (ref 0.0–0.5)
HEMATOCRIT: 42.4 % (ref 34.8–46.6)
HGB: 13.8 g/dL (ref 11.6–15.9)
LYMPH#: 1.2 10*3/uL (ref 0.9–3.3)
LYMPH%: 21.8 % (ref 14.0–48.0)
MCH: 29.8 pg (ref 26.0–34.0)
MCHC: 32.5 g/dL (ref 32.0–36.0)
MCV: 92 fL (ref 81–101)
MONO#: 0.3 10*3/uL (ref 0.1–0.9)
MONO%: 6.2 % (ref 0.0–13.0)
NEUT%: 70.5 % (ref 39.6–80.0)
NEUTROS ABS: 3.9 10*3/uL (ref 1.5–6.5)
PLATELETS: 176 10*3/uL (ref 145–400)
RBC: 4.63 10*6/uL (ref 3.70–5.32)
RDW: 14.4 % (ref 11.1–15.7)
WBC: 5.5 10*3/uL (ref 3.9–10.0)

## 2013-12-09 MED ORDER — FULVESTRANT 250 MG/5ML IM SOLN
500.0000 mg | Freq: Once | INTRAMUSCULAR | Status: AC
  Administered 2013-12-09: 500 mg via INTRAMUSCULAR
  Filled 2013-12-09: qty 10

## 2013-12-09 MED ORDER — PALBOCICLIB 100 MG PO CAPS: 100.0000 mg | ORAL_CAPSULE | Freq: Every day | ORAL | Status: DC

## 2013-12-09 MED ORDER — FULVESTRANT 250 MG/5ML IM SOLN
INTRAMUSCULAR | Status: AC
  Filled 2013-12-09: qty 10

## 2013-12-09 NOTE — Patient Instructions (Signed)
Fulvestrant injection  What is this medicine?  FULVESTRANT (ful VES trant) blocks the effects of estrogen. It is used to treat breast cancer in women past the age of menopause.  This medicine may be used for other purposes; ask your health care provider or pharmacist if you have questions.  COMMON BRAND NAME(S): FASLODEX  What should I tell my health care provider before I take this medicine?  They need to know if you have any of these conditions:  -bleeding problems  -liver disease  -low levels of platelets in the blood  -an unusual or allergic reaction to fulvestrant, other medicines, foods, dyes, or preservatives  -pregnant or trying to get pregnant  -breast-feeding  How should I use this medicine?  This medicine is for injection into a muscle. It is usually given by a health care professional in a hospital or clinic setting.  Talk to your pediatrician regarding the use of this medicine in children. Special care may be needed.  Overdosage: If you think you have taken too much of this medicine contact a poison control center or emergency room at once.  NOTE: This medicine is only for you. Do not share this medicine with others.  What if I miss a dose?  It is important not to miss your dose. Call your doctor or health care professional if you are unable to keep an appointment.  What may interact with this medicine?  -medicines that treat or prevent blood clots like warfarin, enoxaparin, and dalteparin  This list may not describe all possible interactions. Give your health care provider a list of all the medicines, herbs, non-prescription drugs, or dietary supplements you use. Also tell them if you smoke, drink alcohol, or use illegal drugs. Some items may interact with your medicine.  What should I watch for while using this medicine?  Your condition will be monitored carefully while you are receiving this medicine. You will need important blood work done while you are taking this medicine.  Do not become pregnant  while taking this medicine. Women should inform their doctor if they wish to become pregnant or think they might be pregnant. There is a potential for serious side effects to an unborn child. Talk to your health care professional or pharmacist for more information.  What side effects may I notice from receiving this medicine?  Side effects that you should report to your doctor or health care professional as soon as possible:  -allergic reactions like skin rash, itching or hives, swelling of the face, lips, or tongue  -feeling faint or lightheaded, falls  -fever or flu-like symptoms  -sore throat  -vaginal bleeding  Side effects that usually do not require medical attention (report to your doctor or health care professional if they continue or are bothersome):  -aches, pains  -constipation or diarrhea  -headache  -hot flashes  -nausea, vomiting  -pain at site where injected  -stomach pain  This list may not describe all possible side effects. Call your doctor for medical advice about side effects. You may report side effects to FDA at 1-800-FDA-1088.  Where should I keep my medicine?  This drug is given in a hospital or clinic and will not be stored at home.  NOTE: This sheet is a summary. It may not cover all possible information. If you have questions about this medicine, talk to your doctor, pharmacist, or health care provider.   2015, Elsevier/Gold Standard. (2007-08-12 15:39:24)

## 2013-12-10 ENCOUNTER — Ambulatory Visit
Admission: RE | Admit: 2013-12-10 | Discharge: 2013-12-10 | Disposition: A | Discharge status: Home / Self Care | Payer: Medicare Other | Source: Ambulatory Visit | Attending: Radiation Oncology | Admitting: Radiation Oncology

## 2013-12-10 DIAGNOSIS — C50912 Malignant neoplasm of unspecified site of left female breast: Secondary | ICD-10-CM

## 2013-12-10 DIAGNOSIS — Z510 Encounter for antineoplastic radiation therapy: Secondary | ICD-10-CM | POA: Diagnosis not present

## 2013-12-10 LAB — VITAMIN D 25 HYDROXY (VIT D DEFICIENCY, FRACTURES): VIT D 25 HYDROXY: 62 ng/mL (ref 30–89)

## 2013-12-10 LAB — CANCER ANTIGEN 27.29: CA 27.29: 59 U/mL — ABNORMAL HIGH (ref 0–39)

## 2013-12-10 NOTE — Progress Notes (Signed)
Hematology and Oncology Follow Up Visit  Erin Beasley [id number] [birth year] 80 y.o. 12/10/2013   Principle Diagnosis:   Metastatic breast cancer-ER positive  CVA with residual right-sided weakness  Chronic atrial fibrillation  Current Therapy:   Faslodex 500 mg IM every month Patient to start radiation therapy to the left chest wall Ibrance 100 mg by mouth daily     Interim History:  Erin Beasley is back for followup. She has not yet started radiation. She has been simulated. She will start tomorrow or Thursday.  We did do a PET scan on her. This was done a couple weeks ago. The PET scan, in essence, showed stable disease. There may been some uptake in the right scapula although nothing was seen on scans.  She's been doing pretty well. Her appetite is good. She's had no nausea vomiting.  She is on Coumadin. There's been no bleeding.  I spoke to them last him about adding Ibrance. We will try to see about getting this for her and not have any cost a lot of money. I think the financial aspect of Erin Beasley is going to be a problem.  She's had no fever. She's had no cough. She's had no change in bowel or bladder habits.  Medications: Current outpatient prescriptions:calcium carbonate (TUMS - DOSED IN MG ELEMENTAL CALCIUM) 500 MG chewable tablet, Chew 1 tablet by mouth daily., Disp: , Rfl: ;  Cholecalciferol (VITAMIN D PO), Take by mouth every morning. , Disp: , Rfl: ;  diltiazem (CARDIZEM CD) 120 MG 24 hr capsule, TAKE ONE CAPSULE EVERY DAY, Disp: 30 capsule, Rfl: 3;  famotidine (PEPCID) 20 MG tablet, Take 20 mg by mouth daily. , Disp: , Rfl:  Fulvestrant (FASLODEX IM), Inject into the muscle every 30 (thirty) days., Disp: , Rfl: ;  metoprolol (LOPRESSOR) 50 MG tablet, Take 50 mg by mouth 2 (two) times daily. , Disp: , Rfl: ;  simvastatin (ZOCOR) 20 MG tablet, Take 20 mg by mouth at bedtime. , Disp: , Rfl: ;  warfarin (COUMADIN) 5 MG tablet, Take 5 mg by mouth daily. ONLY  TAKES ON  T-T-S-S NO OTHER DAYS, Disp: , Rfl:  palbociclib (IBRANCE) 100 MG capsule, Take 1 capsule (100 mg total) by mouth daily with breakfast. Take whole with food., Disp: 30 capsule, Rfl: 6  Allergies: No Known Allergies  Past Medical History, Surgical history, Social history, and Family History were reviewed and updated.  Review of Systems: As above  Physical Exam:  height is 5\' 6"  (1.676 m) and weight is 172 lb (78.019 kg). Her oral temperature is 97.7 F (36.5 C). Her blood pressure is 101/63 and her pulse is 97. Her respiration is 20.   Well-developed well-nourished African American female. Head and neck exam shows no ocular or oral lesions. She has no palpable cervical or supraclavicular nodes. Lungs are clear. Cardiac exam is consistent with atrial fibrillation. Rate is well controlled. Chest wall exam shows the left chest wall recurrence. There is no bleeding from this. There is no exudate. There is no left axillary adenopathy. Abdomen is soft. She has good bowel sounds. There is no palpable liver or spleen. Extremities shows no clubbing, cyanosis or edema. She has a chronic right-sided weakness. Skin exam shows no rashes, ecchymosis or petechia.  Lab Results  Component Value Date   WBC 5.5 12/09/2013   HGB 13.8 12/09/2013   HCT 42.4 12/09/2013   MCV 92 12/09/2013   PLT 176 12/09/2013     Chemistry  Component Value Date/Time   NA 142 12/09/2013 1045   NA 144 06/09/2013 0850   NA 141 01/12/2012 1332   K 4.2 12/09/2013 1045   K 4.4 06/09/2013 0850   K 4.5 01/12/2012 1332   CL 103 12/09/2013 1045   CL 105 06/09/2013 0850   CL 106 01/12/2012 1332   CO2 27 12/09/2013 1045   CO2 28 06/09/2013 0850   CO2 26 01/12/2012 1332   BUN 11 12/09/2013 1045   BUN 17 06/09/2013 0850   BUN 19.0 01/12/2012 1332   CREATININE 1.1 12/09/2013 1045   CREATININE 1.20* 06/09/2013 0850   CREATININE 1.1 01/12/2012 1332      Component Value Date/Time   CALCIUM 8.6 12/09/2013 1045   CALCIUM 9.2 06/09/2013 0850    CALCIUM 9.6 01/12/2012 1332   ALKPHOS 40 12/09/2013 1045   ALKPHOS 70 06/09/2013 0850   ALKPHOS 69 01/12/2012 1332   AST 19 12/09/2013 1045   AST 16 06/09/2013 0850   AST 25 01/12/2012 1332   ALT 10 12/09/2013 1045   ALT 9 06/09/2013 0850   ALT 18 01/12/2012 1332   BILITOT 0.70 12/09/2013 1045   BILITOT 0.7 06/09/2013 0850   BILITOT 0.60 01/12/2012 1332         Impression and Plan: Erin Beasley is 80 year old African American female. She has metastatic breast cancer. Her cancer is hormone receptor positive. She has been doing well. Her disease has been slowly progressing.  Again, radiation will definitely help the left chest wall recurrence. She will start treatments.  We will see about adding the Ibrance to Faslodex.  I want to go back to see [place] in another month.  I spent about 30 minutes with she and her family.     Volanda Napoleon, MD 8/26/20157:17 AM

## 2013-12-11 ENCOUNTER — Ambulatory Visit
Admission: RE | Admit: 2013-12-11 | Discharge: 2013-12-11 | Disposition: A | Discharge status: Home / Self Care | Payer: Medicare Other | Source: Ambulatory Visit | Attending: Radiation Oncology | Admitting: Radiation Oncology

## 2013-12-11 DIAGNOSIS — Z510 Encounter for antineoplastic radiation therapy: Secondary | ICD-10-CM | POA: Diagnosis not present

## 2013-12-12 ENCOUNTER — Ambulatory Visit
Admission: RE | Admit: 2013-12-12 | Discharge: 2013-12-12 | Disposition: A | Discharge status: Home / Self Care | Payer: Medicare Other | Source: Ambulatory Visit | Attending: Radiation Oncology | Admitting: Radiation Oncology

## 2013-12-12 ENCOUNTER — Telehealth: Payer: Self-pay | Admitting: Hematology & Oncology

## 2013-12-12 DIAGNOSIS — C50912 Malignant neoplasm of unspecified site of left female breast: Secondary | ICD-10-CM

## 2013-12-12 DIAGNOSIS — Z510 Encounter for antineoplastic radiation therapy: Secondary | ICD-10-CM | POA: Diagnosis not present

## 2013-12-12 MED ORDER — ALRA NON-METALLIC DEODORANT (RAD-ONC)
1.0000 "application " | Freq: Once | TOPICAL | Status: AC
  Administered 2013-12-12: 1 via TOPICAL

## 2013-12-12 MED ORDER — RADIAPLEXRX EX GEL
Freq: Once | CUTANEOUS | Status: AC
  Administered 2013-12-12: 15:00:00 via TOPICAL

## 2013-12-12 NOTE — Progress Notes (Signed)
Erin Beasley in the clinic with her family.  She was given the Radiation Therapy and You book and discussed potential side effects/management of fatigue and skin changes.  She was given Alra Deoderant and radiaplex gel.  She was instructed to apply the radiaplex gel to her left chest wall after treatment and at bedtime.  She was educated about under treat day with Dr. Sondra Come on Tuesday's.  She was advised to call with any questions or concerns.

## 2013-12-12 NOTE — Telephone Encounter (Signed)
[employer] approved IBRANCE 100 MG CAPS   Dates: 12/11/2013 - 12/12/2014 Auth: [id number]    P: [phone]    COPY SCANNED

## 2013-12-12 NOTE — Telephone Encounter (Signed)
Congratulations! Based on the information you have provided, you have qualified for PAN assistance Here is the information you will need to process a claim through your pharmacy: Patient Status:Approval for Metastatic Breast Cancer - Medicare  Available Balance:$7500.00  Enrollment Start Date:12/12/2013  Enrollment End Date:12/12/2014  NAME:Erin Beasley  JO:[id number]  MCNOB:[id number]  QHUTM:[id number] The prescription program is NOT an insurance benefit  You will receive a pharmacy benefits card by mail that lists your ID number, Rx Group number and Rx BIN number. Take the card with you to the pharmacy where it will be used for payment. Pharmacies with the capability to bill secondary or tertiary claims electronically can submit claims directly to the [employer]. If you don't have your benefit card, the pharmacy can call toll-free at [phone]-PANF [phone] extension [phone] for electronic billing assistance, which is available Monday through Friday, 9 a.m. to 5 p.m. Eastern Time.  Now that you are enrolled, we invite you to register for the PAN Patient Portal. The PAN Patient Portal will allow you to access your grant balance and enrollment status 24 hours a day, seven days a week.   IBRANCE 100mg 

## 2013-12-15 ENCOUNTER — Ambulatory Visit
Admission: RE | Admit: 2013-12-15 | Discharge: 2013-12-15 | Disposition: A | Discharge status: Home / Self Care | Payer: Medicare Other | Source: Ambulatory Visit | Attending: Radiation Oncology | Admitting: Radiation Oncology

## 2013-12-15 DIAGNOSIS — Z510 Encounter for antineoplastic radiation therapy: Secondary | ICD-10-CM | POA: Diagnosis not present

## 2013-12-16 ENCOUNTER — Encounter: Payer: Self-pay | Admitting: Radiation Oncology

## 2013-12-16 ENCOUNTER — Ambulatory Visit
Admission: RE | Admit: 2013-12-16 | Discharge: 2013-12-16 | Disposition: A | Discharge status: Home / Self Care | Payer: Medicare Other | Source: Ambulatory Visit | Attending: Radiation Oncology | Admitting: Radiation Oncology

## 2013-12-16 VITALS — BP 99/62 | HR 87 | Temp 98.5°F | Ht 66.0 in | Wt 173.7 lb

## 2013-12-16 DIAGNOSIS — C50912 Malignant neoplasm of unspecified site of left female breast: Secondary | ICD-10-CM

## 2013-12-16 DIAGNOSIS — Z510 Encounter for antineoplastic radiation therapy: Secondary | ICD-10-CM | POA: Diagnosis not present

## 2013-12-16 NOTE — Progress Notes (Signed)
  Radiation Oncology         [phone] ________________________________  Name: Erin Beasley MRN: [id number]  Date: 12/16/2013  DOB: [birth year]  Weekly Radiation Therapy Management  DIAGNOSIS: Recurrent left breast cancer   Current Dose: 15 Gy     Planned Dose:  30 Gy  Narrative . . . . . . . . The patient presents for routine under treatment assessment.                                   The patient is without complaint.                                 Set-up films were reviewed.                                 The chart was checked. Physical Findings. . .  height is 5\' 6"  (1.676 m) and weight is 173 lb 11.2 oz (78.79 kg). Her oral temperature is 98.5 F (36.9 C). Her blood pressure is 99/62 and her pulse is 87. . The tumor is nodule along the left chest wall is starting to respond to treatment. There's some mild drainage from the area but no bleeding Impression . . . . . . . The patient is tolerating radiation. Plan . . . . . . . . . . . . Continue treatment as planned.  ________________________________   Blair Promise, PhD, MD

## 2013-12-16 NOTE — Progress Notes (Signed)
Erin Beasley has completed 5/10 fractions to her left chest wall.  She denies pain. She reports fatigue.  The skin on her left chest is intact.  She reports the open area under her left arm is improving.  She is using radiaplex gel.

## 2013-12-17 ENCOUNTER — Ambulatory Visit
Admission: RE | Admit: 2013-12-17 | Discharge: 2013-12-17 | Disposition: A | Discharge status: Home / Self Care | Payer: Medicare Other | Source: Ambulatory Visit | Attending: Radiation Oncology | Admitting: Radiation Oncology

## 2013-12-17 DIAGNOSIS — Z510 Encounter for antineoplastic radiation therapy: Secondary | ICD-10-CM | POA: Diagnosis not present

## 2013-12-18 ENCOUNTER — Ambulatory Visit
Admission: RE | Admit: 2013-12-18 | Discharge: 2013-12-18 | Disposition: A | Discharge status: Home / Self Care | Payer: Medicare Other | Source: Ambulatory Visit | Attending: Radiation Oncology | Admitting: Radiation Oncology

## 2013-12-18 DIAGNOSIS — Z510 Encounter for antineoplastic radiation therapy: Secondary | ICD-10-CM | POA: Diagnosis not present

## 2013-12-19 ENCOUNTER — Ambulatory Visit
Admission: RE | Admit: 2013-12-19 | Discharge: 2013-12-19 | Disposition: A | Discharge status: Home / Self Care | Payer: Medicare Other | Source: Ambulatory Visit | Attending: Radiation Oncology | Admitting: Radiation Oncology

## 2013-12-19 DIAGNOSIS — Z510 Encounter for antineoplastic radiation therapy: Secondary | ICD-10-CM | POA: Diagnosis not present

## 2013-12-23 ENCOUNTER — Other Ambulatory Visit: Payer: Self-pay | Admitting: Hematology & Oncology

## 2013-12-23 ENCOUNTER — Ambulatory Visit
Admission: RE | Admit: 2013-12-23 | Discharge: 2013-12-23 | Disposition: A | Discharge status: Home / Self Care | Payer: Medicare Other | Source: Ambulatory Visit | Attending: Radiation Oncology | Admitting: Radiation Oncology

## 2013-12-23 DIAGNOSIS — Z510 Encounter for antineoplastic radiation therapy: Secondary | ICD-10-CM | POA: Diagnosis not present

## 2013-12-24 ENCOUNTER — Encounter: Payer: Self-pay | Admitting: Radiation Oncology

## 2013-12-24 ENCOUNTER — Ambulatory Visit
Admission: RE | Admit: 2013-12-24 | Discharge: 2013-12-24 | Disposition: A | Discharge status: Home / Self Care | Payer: Medicare Other | Source: Ambulatory Visit | Attending: Radiation Oncology | Admitting: Radiation Oncology

## 2013-12-24 VITALS — BP 87/69 | HR 101 | Temp 98.5°F | Resp 20 | Wt 170.7 lb

## 2013-12-24 DIAGNOSIS — Z510 Encounter for antineoplastic radiation therapy: Secondary | ICD-10-CM | POA: Diagnosis not present

## 2013-12-24 DIAGNOSIS — C50912 Malignant neoplasm of unspecified site of left female breast: Secondary | ICD-10-CM

## 2013-12-24 NOTE — Progress Notes (Signed)
  Radiation Oncology         [phone] ________________________________  Name: Erin Beasley MRN: [id number]  Date: 12/24/2013  DOB: 08/31/1928  Weekly Radiation Therapy Management  DIAGNOSIS: Recurrent left breast cancer   Current Dose: 30 Gy     Planned Dose:  30 Gy  Narrative . . . . . . . . The patient presents for routine under treatment assessment.                                   The patient is without complaint. She has noticed some mild drainage from the left chest area but no bleeding.                                 Set-up films were reviewed.                                 The chart was checked. Physical Findings. . .  weight is 170 lb 11.2 oz (77.429 kg). Her oral temperature is 98.5 F (36.9 C). Her blood pressure is 87/69 and her pulse is 101. Her respiration is 20. . The treatment area shows some flattening out of the tumor is nodule. There is skin breakdown in the area related to tumor and radiation shrinkage Impression . . . . . . . The patient is tolerating radiation. Plan . . . . . . . . . . . . . The patient and family were advised to place a Triple Antibiotic ointment on the area after gentle cleansing with saline.  ________________________________   Blair Promise, PhD, MD

## 2013-12-24 NOTE — Progress Notes (Signed)
Weekly rad txs10 completed left chest wall, mild erythema on chest, dressing over axilla, open area  ,  odorous, scant drainage on telfa dressing, fatigued, poor appetite, eats main meal lunch time, fruit at night, low b/p, takes metoprolol bid, will check b/p tonight, uses radiaplex daily 3:16 PM

## 2014-01-07 ENCOUNTER — Encounter: Payer: Self-pay | Admitting: Radiation Oncology

## 2014-01-07 NOTE — Progress Notes (Signed)
  Radiation Oncology         [phone] ________________________________  Name: Erin Beasley MRN: [id number]  Date: 01/07/2014  DOB: Sep 27, 1928  End of Treatment Note  Diagnosis:   Recurrent left breast cancer   Indication for treatment:  Progressive disease along the left lateral chest wall area  Radiation treatment dates:   August 26 through September 9  Site/dose:   Left lateral chest wall mass,  30 gray in 10 fractions  Beams/energy:   Custom electron cutout field, 12 MeV electrons  Narrative: The patient tolerated radiation treatment relatively well.   The tumor mass started regressing at the completion of her treatment  Plan: The patient has completed radiation treatment. The patient will return to radiation oncology clinic for routine followup in one month. I advised them to call or return sooner if they have any questions or concerns related to their recovery or treatment.  -----------------------------------  Blair Promise, PhD, MD

## 2014-01-09 ENCOUNTER — Ambulatory Visit (HOSPITAL_BASED_OUTPATIENT_CLINIC_OR_DEPARTMENT_OTHER): Payer: Medicare Other | Admitting: Hematology & Oncology

## 2014-01-09 ENCOUNTER — Encounter: Payer: Self-pay | Admitting: Hematology & Oncology

## 2014-01-09 ENCOUNTER — Other Ambulatory Visit (HOSPITAL_BASED_OUTPATIENT_CLINIC_OR_DEPARTMENT_OTHER): Payer: Medicare Other | Admitting: Lab

## 2014-01-09 ENCOUNTER — Ambulatory Visit (HOSPITAL_BASED_OUTPATIENT_CLINIC_OR_DEPARTMENT_OTHER): Payer: Medicare Other

## 2014-01-09 VITALS — BP 100/65 | HR 96 | Temp 97.4°F | Resp 14 | Ht 66.0 in | Wt 169.0 lb

## 2014-01-09 DIAGNOSIS — C50919 Malignant neoplasm of unspecified site of unspecified female breast: Secondary | ICD-10-CM

## 2014-01-09 DIAGNOSIS — C50912 Malignant neoplasm of unspecified site of left female breast: Secondary | ICD-10-CM

## 2014-01-09 DIAGNOSIS — C779 Secondary and unspecified malignant neoplasm of lymph node, unspecified: Secondary | ICD-10-CM

## 2014-01-09 DIAGNOSIS — M6281 Muscle weakness (generalized): Secondary | ICD-10-CM

## 2014-01-09 DIAGNOSIS — Z5111 Encounter for antineoplastic chemotherapy: Secondary | ICD-10-CM

## 2014-01-09 LAB — CBC WITH DIFFERENTIAL (CANCER CENTER ONLY)
BASO#: 0 10*3/uL (ref 0.0–0.2)
BASO%: 0.5 % (ref 0.0–2.0)
EOS ABS: 0 10*3/uL (ref 0.0–0.5)
EOS%: 0.5 % (ref 0.0–7.0)
HCT: 39.8 % (ref 34.8–46.6)
HGB: 13.1 g/dL (ref 11.6–15.9)
LYMPH#: 0.9 10*3/uL (ref 0.9–3.3)
LYMPH%: 48.9 % — AB (ref 14.0–48.0)
MCH: 30.2 pg (ref 26.0–34.0)
MCHC: 32.9 g/dL (ref 32.0–36.0)
MCV: 92 fL (ref 81–101)
MONO#: 0 10*3/uL — ABNORMAL LOW (ref 0.1–0.9)
MONO%: 1.6 % (ref 0.0–13.0)
NEUT#: 0.9 10*3/uL — ABNORMAL LOW (ref 1.5–6.5)
NEUT%: 48.5 % (ref 39.6–80.0)
RBC: 4.34 10*6/uL (ref 3.70–5.32)
RDW: 14.8 % (ref 11.1–15.7)
WBC: 1.8 10*3/uL — ABNORMAL LOW (ref 3.9–10.0)

## 2014-01-09 LAB — COMPREHENSIVE METABOLIC PANEL
ALBUMIN: 3.7 g/dL (ref 3.5–5.2)
ALT: 10 U/L (ref 0–35)
AST: 16 U/L (ref 0–37)
Alkaline Phosphatase: 57 U/L (ref 39–117)
BUN: 18 mg/dL (ref 6–23)
CHLORIDE: 105 meq/L (ref 96–112)
CO2: 27 mEq/L (ref 19–32)
Calcium: 8.9 mg/dL (ref 8.4–10.5)
Creatinine, Ser: 1.48 mg/dL — ABNORMAL HIGH (ref 0.50–1.10)
Glucose, Bld: 114 mg/dL — ABNORMAL HIGH (ref 70–99)
POTASSIUM: 3.9 meq/L (ref 3.5–5.3)
Sodium: 142 mEq/L (ref 135–145)
Total Bilirubin: 0.6 mg/dL (ref 0.2–1.2)
Total Protein: 6.5 g/dL (ref 6.0–8.3)

## 2014-01-09 LAB — CANCER ANTIGEN 27.29: CA 27.29: 58 U/mL — ABNORMAL HIGH (ref 0–39)

## 2014-01-09 LAB — LACTATE DEHYDROGENASE: LDH: 128 U/L (ref 94–250)

## 2014-01-09 MED ORDER — FULVESTRANT 250 MG/5ML IM SOLN
500.0000 mg | Freq: Once | INTRAMUSCULAR | Status: AC
  Administered 2014-01-09: 500 mg via INTRAMUSCULAR
  Filled 2014-01-09: qty 10

## 2014-01-09 MED ORDER — FULVESTRANT 250 MG/5ML IM SOLN
INTRAMUSCULAR | Status: AC
  Filled 2014-01-09: qty 10

## 2014-01-09 NOTE — Patient Instructions (Signed)
Fulvestrant injection  What is this medicine?  FULVESTRANT (ful VES trant) blocks the effects of estrogen. It is used to treat breast cancer in women past the age of menopause.  This medicine may be used for other purposes; ask your health care provider or pharmacist if you have questions.  COMMON BRAND NAME(S): FASLODEX  What should I tell my health care provider before I take this medicine?  They need to know if you have any of these conditions:  -bleeding problems  -liver disease  -low levels of platelets in the blood  -an unusual or allergic reaction to fulvestrant, other medicines, foods, dyes, or preservatives  -pregnant or trying to get pregnant  -breast-feeding  How should I use this medicine?  This medicine is for injection into a muscle. It is usually given by a health care professional in a hospital or clinic setting.  Talk to your pediatrician regarding the use of this medicine in children. Special care may be needed.  Overdosage: If you think you have taken too much of this medicine contact a poison control center or emergency room at once.  NOTE: This medicine is only for you. Do not share this medicine with others.  What if I miss a dose?  It is important not to miss your dose. Call your doctor or health care professional if you are unable to keep an appointment.  What may interact with this medicine?  -medicines that treat or prevent blood clots like warfarin, enoxaparin, and dalteparin  This list may not describe all possible interactions. Give your health care provider a list of all the medicines, herbs, non-prescription drugs, or dietary supplements you use. Also tell them if you smoke, drink alcohol, or use illegal drugs. Some items may interact with your medicine.  What should I watch for while using this medicine?  Your condition will be monitored carefully while you are receiving this medicine. You will need important blood work done while you are taking this medicine.  Do not become pregnant  while taking this medicine. Women should inform their doctor if they wish to become pregnant or think they might be pregnant. There is a potential for serious side effects to an unborn child. Talk to your health care professional or pharmacist for more information.  What side effects may I notice from receiving this medicine?  Side effects that you should report to your doctor or health care professional as soon as possible:  -allergic reactions like skin rash, itching or hives, swelling of the face, lips, or tongue  -feeling faint or lightheaded, falls  -fever or flu-like symptoms  -sore throat  -vaginal bleeding  Side effects that usually do not require medical attention (report to your doctor or health care professional if they continue or are bothersome):  -aches, pains  -constipation or diarrhea  -headache  -hot flashes  -nausea, vomiting  -pain at site where injected  -stomach pain  This list may not describe all possible side effects. Call your doctor for medical advice about side effects. You may report side effects to FDA at 1-800-FDA-1088.  Where should I keep my medicine?  This drug is given in a hospital or clinic and will not be stored at home.  NOTE: This sheet is a summary. It may not cover all possible information. If you have questions about this medicine, talk to your doctor, pharmacist, or health care provider.   2015, Elsevier/Gold Standard. (2007-08-12 15:39:24)

## 2014-01-09 NOTE — Progress Notes (Signed)
Hematology and Oncology Follow Up Visit  Erin Beasley [id number] 10-May-1928 80 y.o. 01/09/2014   Principle Diagnosis:   Metastatic breast cancer-ER positive  CVA with residual right-sided weakness  Chronic atrial fibrillation  Current Therapy:   Faslodex 500 mg IM every month Patient completed radiation therapy to the left chest wall Ibrance 100 mg by mouth  (21/7)     Interim History:  [name] is back for followup. She is doing pretty well. She completed her radiation therapy for the left chest wall lesion. This was back in September. She completed 10 treatments of 3000 rads. This was completed on September 9. That we got her on the [place]. We will make sure she takes it 21 days on followed by 7 days off.   She has some skin breakdown at the radiation site. She has this addressed.  She's had no fever. She's had no bleeding. She's on Coumadin for the atrial fibrillation. There's been no problems with her bowels or bladder. She's had no leg swelling.  There is still some weakness over on the right side. This is chronic.  Her appetite has been doing very well.  Medications: Current outpatient prescriptions:calcium carbonate (TUMS - DOSED IN MG ELEMENTAL CALCIUM) 500 MG chewable tablet, Chew 1 tablet by mouth daily., Disp: , Rfl: ;  Cholecalciferol (VITAMIN D PO), Take by mouth every morning. , Disp: , Rfl: ;  diltiazem (CARDIZEM CD) 120 MG 24 hr capsule, TAKE ONE CAPSULE BY MOUTH EVERY DAY, Disp: 30 capsule, Rfl: 3;  famotidine (PEPCID) 20 MG tablet, Take 20 mg by mouth daily. , Disp: , Rfl:  Fulvestrant (FASLODEX IM), Inject into the muscle every 30 (thirty) days., Disp: , Rfl: ;  hyaluronate sodium (RADIAPLEXRX) GEL, Apply 1 application topically once., Disp: , Rfl: ;  metoprolol (LOPRESSOR) 50 MG tablet, Take 50 mg by mouth 2 (two) times daily. , Disp: , Rfl: ;  non-metallic deodorant (ALRA) MISC, Apply 1 application topically daily as needed., Disp: , Rfl:  palbociclib  (IBRANCE) 100 MG capsule, Take 1 capsule (100 mg total) by mouth daily with breakfast. Take whole with food., Disp: 30 capsule, Rfl: 6;  simvastatin (ZOCOR) 20 MG tablet, Take 20 mg by mouth at bedtime. , Disp: , Rfl: ;  warfarin (COUMADIN) 5 MG tablet, Take 5 mg by mouth daily. ONLY  TAKES ON T-T-S-S NO OTHER DAYS, Disp: , Rfl:   Allergies: No Known Allergies  Past Medical History, Surgical history, Social history, and Family History were reviewed and updated.  Review of Systems: As above  Physical Exam:  height is 5\' 6"  (1.676 m) and weight is 169 lb (76.658 kg). Her oral temperature is 97.4 F (36.3 C). Her blood pressure is 100/65 and her pulse is 96. Her respiration is 14.   [name]. Lungs are clear. Cardiac exam regular rate and rhythm consistent with atrial fibrillation. She has no murmurs. Chest wall exam shows the left chest wall lesion. This looks about the same. She does have some skin breakdown has some radiation dermatitis. Abdomen is soft. She has good bowel sounds. There is no palpable liver or spleen. Back exam no tenderness over the spine. Extremities shows no clubbing, cyanosis or edema. His weakness over on the right side. Skin exam no rashes. Again she has a radiation dermatitis changes in the left axilla/ lateral chest wall.  Lab Results  Component Value Date   WBC 1.8* 01/09/2014   HGB 13.1 01/09/2014   HCT 39.8 01/09/2014  MCV 92 01/09/2014   PLT 81 Platelet count confirmed by slide estimate* 01/09/2014     Chemistry      Component Value Date/Time   NA 142 12/09/2013 1045   NA 144 06/09/2013 0850   NA 141 01/12/2012 1332   K 4.2 12/09/2013 1045   K 4.4 06/09/2013 0850   K 4.5 01/12/2012 1332   CL 103 12/09/2013 1045   CL 105 06/09/2013 0850   CL 106 01/12/2012 1332   CO2 27 12/09/2013 1045   CO2 28 06/09/2013 0850   CO2 26 01/12/2012 1332   BUN 11 12/09/2013 1045   BUN 17 06/09/2013 0850   BUN 19.0 01/12/2012 1332   CREATININE 1.1 12/09/2013 1045    CREATININE 1.20* 06/09/2013 0850   CREATININE 1.1 01/12/2012 1332      Component Value Date/Time   CALCIUM 8.6 12/09/2013 1045   CALCIUM 9.2 06/09/2013 0850   CALCIUM 9.6 01/12/2012 1332   ALKPHOS 40 12/09/2013 1045   ALKPHOS 70 06/09/2013 0850   ALKPHOS 69 01/12/2012 1332   AST 19 12/09/2013 1045   AST 16 06/09/2013 0850   AST 25 01/12/2012 1332   ALT 10 12/09/2013 1045   ALT 9 06/09/2013 0850   ALT 18 01/12/2012 1332   BILITOT 0.70 12/09/2013 1045   BILITOT 0.7 06/09/2013 0850   BILITOT 0.60 01/12/2012 1332         Impression and Plan: Erin Beasley is a 80 year old female. She has metastatic breast cancer. We are trying to treat her locally for control. She is on [place]. She is on Faslodex. She just got radiation treatments.  I really cannot tell much in the way of improvement so far. [place], we will see some improvement in the future.  We probably will not get another PET scan on her for 2 months.  I told her to stop the Ibrance right now. Her white cell blood count are on the low side. I told her to restart Ibrance on October 5 and this will be stopped on October 25.  I'll plan to see her back in another month.   Volanda Napoleon, MD 9/25/20151:52 PM

## 2014-01-22 ENCOUNTER — Other Ambulatory Visit: Payer: Self-pay | Admitting: *Deleted

## 2014-01-22 MED ORDER — DILTIAZEM HCL ER COATED BEADS 120 MG PO CP24: 120.0000 mg | ORAL_CAPSULE | Freq: Every day | ORAL | Status: DC

## 2014-02-02 ENCOUNTER — Ambulatory Visit
Admission: RE | Admit: 2014-02-02 | Discharge: 2014-02-02 | Disposition: A | Discharge status: Home / Self Care | Payer: Medicare Other | Source: Ambulatory Visit | Attending: Radiation Oncology | Admitting: Radiation Oncology

## 2014-02-02 ENCOUNTER — Encounter: Payer: Self-pay | Admitting: Oncology

## 2014-02-02 VITALS — BP 86/49 | HR 69 | Temp 97.8°F | Resp 20 | Ht 66.0 in | Wt 169.5 lb

## 2014-02-02 DIAGNOSIS — C50912 Malignant neoplasm of unspecified site of left female breast: Secondary | ICD-10-CM

## 2014-02-02 HISTORY — DX: Reserved for inherently not codable concepts without codable children: IMO0001

## 2014-02-02 HISTORY — DX: Reserved for concepts with insufficient information to code with codable children: IMO0002

## 2014-02-02 NOTE — Progress Notes (Signed)
Erin Beasley here for follow up after treatment to her left chest wall.  She denies pain.  Her blood pressure today was 86/47.  She is taking carizem and metoprolol.  She denies any dizziness.  She reports fatigue.  The skin under her left arm has hyperpigmentation with a small red area.  She is using radiaplex gel twice a day.

## 2014-02-02 NOTE — Progress Notes (Signed)
  Radiation Oncology         ([phone] ________________________________  Name: Erin Beasley MRN: [id number]  Date: 02/02/2014  DOB: [birth year]  Follow-Up Visit Note  CC: Wenda Low, MD  Volanda Napoleon, MD    ICD-9-CM ICD-10-CM  1. Breast cancer, left 174.9 C50.912    Diagnosis:   Recurrent left breast cancer  Interval Since Last Radiation:  6  weeks  Narrative:  The patient returns today for routine follow-up.  She seems to be doing well at this time. She denies any itching or discomfort along the left chest wall area. Patient and family denies any bleeding or drainage from the area.                              ALLERGIES:  has No Known Allergies.  Meds: Current Outpatient Prescriptions  Medication Sig Dispense Refill  . calcium carbonate (TUMS - DOSED IN MG ELEMENTAL CALCIUM) 500 MG chewable tablet Chew 1 tablet by mouth daily.      . Cholecalciferol (VITAMIN D PO) Take by mouth every morning.       . diltiazem (CARDIZEM CD) 120 MG 24 hr capsule Take 1 capsule (120 mg total) by mouth daily.  30 capsule  3  . famotidine (PEPCID) 20 MG tablet Take 20 mg by mouth daily.       . Fulvestrant (FASLODEX IM) Inject into the muscle every 30 (thirty) days.      . hyaluronate sodium (RADIAPLEXRX) GEL Apply 1 application topically once.      . metoprolol (LOPRESSOR) 50 MG tablet Take 50 mg by mouth 2 (two) times daily.       . non-metallic deodorant Jethro Poling) MISC Apply 1 application topically daily as needed.      . palbociclib (IBRANCE) 100 MG capsule Take 1 capsule (100 mg total) by mouth daily with breakfast. Take whole with food.  30 capsule  6  . simvastatin (ZOCOR) 20 MG tablet Take 20 mg by mouth at bedtime.       Marland Kitchen warfarin (COUMADIN) 5 MG tablet Take 5 mg by mouth daily. ONLY  TAKES ON T-T-S-S NO OTHER DAYS       No current facility-administered medications for this encounter.    Physical Findings: The patient is in no acute distress. Patient is alert and oriented.  height is 5\' 6"  (1.676 m) and weight is 169 lb 8 oz (76.885 kg). Her oral temperature is 97.8 F (36.6 C). Her blood pressure is 86/49 and her pulse is 69. Her respiration is 20. .  The area of recurrence along the left chest wall area has decreased significantly in size. No drainage or bleeding noted from the area.    Impression:  Favorable response to palliative radiation therapy for left chest wall recurrence  Plan:  When necessary followup in radiation oncology. The patient will continue close followup in medical oncology.  ____________________________________ Blair Promise, MD

## 2014-02-10 ENCOUNTER — Ambulatory Visit (HOSPITAL_BASED_OUTPATIENT_CLINIC_OR_DEPARTMENT_OTHER): Payer: Medicare Other | Admitting: Hematology & Oncology

## 2014-02-10 ENCOUNTER — Other Ambulatory Visit (HOSPITAL_BASED_OUTPATIENT_CLINIC_OR_DEPARTMENT_OTHER): Payer: Medicare Other | Admitting: Lab

## 2014-02-10 ENCOUNTER — Encounter: Payer: Self-pay | Admitting: Hematology & Oncology

## 2014-02-10 ENCOUNTER — Ambulatory Visit (HOSPITAL_BASED_OUTPATIENT_CLINIC_OR_DEPARTMENT_OTHER): Payer: Medicare Other

## 2014-02-10 VITALS — BP 109/53 | HR 80 | Temp 97.7°F | Resp 14 | Ht 66.0 in | Wt 167.0 lb

## 2014-02-10 DIAGNOSIS — I482 Chronic atrial fibrillation: Secondary | ICD-10-CM | POA: Diagnosis not present

## 2014-02-10 DIAGNOSIS — C50912 Malignant neoplasm of unspecified site of left female breast: Secondary | ICD-10-CM

## 2014-02-10 DIAGNOSIS — C50012 Malignant neoplasm of nipple and areola, left female breast: Secondary | ICD-10-CM

## 2014-02-10 DIAGNOSIS — Z5111 Encounter for antineoplastic chemotherapy: Secondary | ICD-10-CM

## 2014-02-10 LAB — CMP (CANCER CENTER ONLY)
ALBUMIN: 3.7 g/dL (ref 3.3–5.5)
ALK PHOS: 48 U/L (ref 26–84)
ALT(SGPT): 14 U/L (ref 10–47)
AST: 21 U/L (ref 11–38)
BUN: 16 mg/dL (ref 7–22)
CO2: 28 mEq/L (ref 18–33)
CREATININE: 1.4 mg/dL — AB (ref 0.6–1.2)
Calcium: 8.8 mg/dL (ref 8.0–10.3)
Chloride: 102 mEq/L (ref 98–108)
GLUCOSE: 85 mg/dL (ref 73–118)
POTASSIUM: 3.8 meq/L (ref 3.3–4.7)
Sodium: 143 mEq/L (ref 128–145)
Total Bilirubin: 0.9 mg/dl (ref 0.20–1.60)
Total Protein: 7.2 g/dL (ref 6.4–8.1)

## 2014-02-10 LAB — CBC WITH DIFFERENTIAL (CANCER CENTER ONLY)
BASO#: 0 10*3/uL (ref 0.0–0.2)
BASO%: 1.3 % (ref 0.0–2.0)
EOS%: 0.8 % (ref 0.0–7.0)
Eosinophils Absolute: 0 10*3/uL (ref 0.0–0.5)
HCT: 38.4 % (ref 34.8–46.6)
HEMOGLOBIN: 12.8 g/dL (ref 11.6–15.9)
LYMPH#: 1.1 10*3/uL (ref 0.9–3.3)
LYMPH%: 47.1 % (ref 14.0–48.0)
MCH: 31.9 pg (ref 26.0–34.0)
MCHC: 33.3 g/dL (ref 32.0–36.0)
MCV: 96 fL (ref 81–101)
MONO#: 0.1 10*3/uL (ref 0.1–0.9)
MONO%: 5.4 % (ref 0.0–13.0)
NEUT%: 45.4 % (ref 39.6–80.0)
NEUTROS ABS: 1.1 10*3/uL — AB (ref 1.5–6.5)
Platelets: 127 10*3/uL — ABNORMAL LOW (ref 145–400)
RBC: 4.01 10*6/uL (ref 3.70–5.32)
RDW: 19.1 % — AB (ref 11.1–15.7)
WBC: 2.4 10*3/uL — ABNORMAL LOW (ref 3.9–10.0)

## 2014-02-10 LAB — PROTIME-INR (CHCC SATELLITE)
INR: 2.1 (ref 2.0–3.5)
Protime: 25.2 Seconds — ABNORMAL HIGH (ref 10.6–13.4)

## 2014-02-10 LAB — CANCER ANTIGEN 27.29: CA 27.29: 53 U/mL — ABNORMAL HIGH (ref 0–39)

## 2014-02-10 MED ORDER — FULVESTRANT 250 MG/5ML IM SOLN
INTRAMUSCULAR | Status: AC
  Filled 2014-02-10: qty 10

## 2014-02-10 MED ORDER — INFLUENZA VAC SPLIT QUAD 0.5 ML IM SUSY
0.5000 mL | PREFILLED_SYRINGE | Freq: Once | INTRAMUSCULAR | Status: AC
  Filled 2014-02-10: qty 0.5

## 2014-02-10 MED ORDER — FULVESTRANT 250 MG/5ML IM SOLN
500.0000 mg | Freq: Once | INTRAMUSCULAR | Status: AC
  Administered 2014-02-10: 500 mg via INTRAMUSCULAR
  Filled 2014-02-10: qty 10

## 2014-02-10 NOTE — Progress Notes (Signed)
Hematology and Oncology Follow Up Visit  Erin Beasley [id number] [birth year] 80 y.o. 02/10/2014   Principle Diagnosis:   Metastatic breast cancer-ER positive  CVA with residual right-sided weakness  Chronic atrial fibrillation  Current Therapy:   Faslodex 500 mg IM every month Ibrance 100 mg by mouth  (21/7)     Interim History:  [name] is back for followup. She is doing pretty well. She completed her radiation therapy for the left chest wall lesion. This was back in September. She has noted that there is improvement in the lesion on the left chest wall.   She now is on [place]. She is tolerating this pretty well. Her blood counts have gone down a little bit but she is asymptomatic.  She's had no fever. She's had no bleeding. She's on Coumadin for the atrial fibrillation. There's been no problems with her bowels or bladder. She's had no leg swelling.  There is still some weakness over on the right side. This is chronic.  Her appetite has been doing very well. She has had no nausea or vomiting. She has had no change in her appetite. She's not noted any count of rashes.  Her last CA27.29 was stable at 58.  Medications: Current outpatient prescriptions:calcium carbonate (TUMS - DOSED IN MG ELEMENTAL CALCIUM) 500 MG chewable tablet, Chew 1 tablet by mouth daily., Disp: , Rfl: ;  Cholecalciferol (VITAMIN D PO), Take by mouth every morning. , Disp: , Rfl: ;  diltiazem (CARDIZEM CD) 120 MG 24 hr capsule, Take 1 capsule (120 mg total) by mouth daily., Disp: 30 capsule, Rfl: 3;  famotidine (PEPCID) 20 MG tablet, Take 20 mg by mouth daily. , Disp: , Rfl:  Fulvestrant (FASLODEX IM), Inject into the muscle every 30 (thirty) days., Disp: , Rfl: ;  hyaluronate sodium (RADIAPLEXRX) GEL, Apply 1 application topically once., Disp: , Rfl: ;  metoprolol (LOPRESSOR) 50 MG tablet, Take 50 mg by mouth 2 (two) times daily. , Disp: , Rfl: ;  non-metallic deodorant (ALRA) MISC, Apply 1 application  topically daily as needed., Disp: , Rfl:  palbociclib (IBRANCE) 100 MG capsule, Take 100 mg by mouth as directed. Take whole with food. ON 21 DAYS AND OFF 7 DAYS AND REPEAT., Disp: , Rfl: ;  simvastatin (ZOCOR) 20 MG tablet, Take 20 mg by mouth at bedtime. , Disp: , Rfl: ;  warfarin (COUMADIN) 5 MG tablet, Take 5 mg by mouth daily. ONLY  TAKES ON T-T-S-S NO OTHER DAYS, Disp: , Rfl:  No current facility-administered medications for this visit. Facility-Administered Medications Ordered in Other Visits: Influenza vac split quadrivalent PF (FLUARIX) injection 0.5 mL, 0.5 mL, Intramuscular, Once, Volanda Napoleon, MD  Allergies: No Known Allergies  Past Medical History, Surgical history, Social history, and Family History were reviewed and updated.  Review of Systems: As above  Physical Exam:  height is 5\' 6"  (1.676 m) and weight is 167 lb (75.751 kg). Her oral temperature is 97.7 F (36.5 C). Her blood pressure is 109/53 and her pulse is 80. Her respiration is 14.   [name]. Lungs are clear. Cardiac exam regular rate and rhythm consistent with atrial fibrillation. She has no murmurs. Chest wall exam shows marked improvement of the the left chest wall lesion. There is much less swelling. There is no exudate. There is no bloody discharge. . Abdomen is soft. She has good bowel sounds. There is no palpable liver or spleen. Back exam no tenderness over the spine. Extremities shows  no clubbing, cyanosis or edema. His weakness over on the right side. Skin exam no rashes. Lab Results  Component Value Date   WBC 2.4* 02/10/2014   HGB 12.8 02/10/2014   HCT 38.4 02/10/2014   MCV 96 02/10/2014   PLT 127* 02/10/2014     Chemistry      Component Value Date/Time   NA 143 02/10/2014 1128   NA 142 01/09/2014 1211   NA 141 01/12/2012 1332   K 3.8 02/10/2014 1128   K 3.9 01/09/2014 1211   K 4.5 01/12/2012 1332   CL 102 02/10/2014 1128   CL 105 01/09/2014 1211   CL 106 01/12/2012 1332    CO2 28 02/10/2014 1128   CO2 27 01/09/2014 1211   CO2 26 01/12/2012 1332   BUN 16 02/10/2014 1128   BUN 18 01/09/2014 1211   BUN 19.0 01/12/2012 1332   CREATININE 1.4* 02/10/2014 1128   CREATININE 1.48* 01/09/2014 1211   CREATININE 1.1 01/12/2012 1332      Component Value Date/Time   CALCIUM 8.8 02/10/2014 1128   CALCIUM 8.9 01/09/2014 1211   CALCIUM 9.6 01/12/2012 1332   ALKPHOS 48 02/10/2014 1128   ALKPHOS 57 01/09/2014 1211   ALKPHOS 69 01/12/2012 1332   AST 21 02/10/2014 1128   AST 16 01/09/2014 1211   AST 25 01/12/2012 1332   ALT 14 02/10/2014 1128   ALT 10 01/09/2014 1211   ALT 18 01/12/2012 1332   BILITOT 0.90 02/10/2014 1128   BILITOT 0.6 01/09/2014 1211   BILITOT 0.60 01/12/2012 1332         Impression and Plan: Erin Beasley is a 80 year old female. She has metastatic breast cancer. We are trying to treat her locally for control. She is on [place]. She is on Faslodex.    We probably will not get another PET scan on her in 1 month .  I'm just very pleased with the results of the radiation. I think that really has helped this left chest wall mass. I believe that the Ibrance with Faslodex will also be helpful.  I'll plan to see her back in another 6 weeks   Volanda Napoleon, MD 10/27/20151:21 PM

## 2014-02-10 NOTE — Patient Instructions (Signed)
Fulvestrant injection  What is this medicine?  FULVESTRANT (ful VES trant) blocks the effects of estrogen. It is used to treat breast cancer in women past the age of menopause.  This medicine may be used for other purposes; ask your health care provider or pharmacist if you have questions.  COMMON BRAND NAME(S): FASLODEX  What should I tell my health care provider before I take this medicine?  They need to know if you have any of these conditions:  -bleeding problems  -liver disease  -low levels of platelets in the blood  -an unusual or allergic reaction to fulvestrant, other medicines, foods, dyes, or preservatives  -pregnant or trying to get pregnant  -breast-feeding  How should I use this medicine?  This medicine is for injection into a muscle. It is usually given by a health care professional in a hospital or clinic setting.  Talk to your pediatrician regarding the use of this medicine in children. Special care may be needed.  Overdosage: If you think you have taken too much of this medicine contact a poison control center or emergency room at once.  NOTE: This medicine is only for you. Do not share this medicine with others.  What if I miss a dose?  It is important not to miss your dose. Call your doctor or health care professional if you are unable to keep an appointment.  What may interact with this medicine?  -medicines that treat or prevent blood clots like warfarin, enoxaparin, and dalteparin  This list may not describe all possible interactions. Give your health care provider a list of all the medicines, herbs, non-prescription drugs, or dietary supplements you use. Also tell them if you smoke, drink alcohol, or use illegal drugs. Some items may interact with your medicine.  What should I watch for while using this medicine?  Your condition will be monitored carefully while you are receiving this medicine. You will need important blood work done while you are taking this medicine.  Do not become pregnant  while taking this medicine. Women should inform their doctor if they wish to become pregnant or think they might be pregnant. There is a potential for serious side effects to an unborn child. Talk to your health care professional or pharmacist for more information.  What side effects may I notice from receiving this medicine?  Side effects that you should report to your doctor or health care professional as soon as possible:  -allergic reactions like skin rash, itching or hives, swelling of the face, lips, or tongue  -feeling faint or lightheaded, falls  -fever or flu-like symptoms  -sore throat  -vaginal bleeding  Side effects that usually do not require medical attention (report to your doctor or health care professional if they continue or are bothersome):  -aches, pains  -constipation or diarrhea  -headache  -hot flashes  -nausea, vomiting  -pain at site where injected  -stomach pain  This list may not describe all possible side effects. Call your doctor for medical advice about side effects. You may report side effects to FDA at 1-800-FDA-1088.  Where should I keep my medicine?  This drug is given in a hospital or clinic and will not be stored at home.  NOTE: This sheet is a summary. It may not cover all possible information. If you have questions about this medicine, talk to your doctor, pharmacist, or health care provider.   2015, Elsevier/Gold Standard. (2007-08-12 15:39:24)

## 2014-02-13 ENCOUNTER — Telehealth: Payer: Self-pay | Admitting: Hematology & Oncology

## 2014-02-13 NOTE — Telephone Encounter (Signed)
Pt aware of 12-3 PET to be NPO 6 hrs and 12-8 MD

## 2014-03-19 ENCOUNTER — Ambulatory Visit (HOSPITAL_COMMUNITY)
Admission: RE | Admit: 2014-03-19 | Discharge: 2014-03-19 | Disposition: A | Discharge status: Home / Self Care | Payer: Medicare Other | Source: Ambulatory Visit | Attending: Hematology & Oncology | Admitting: Hematology & Oncology

## 2014-03-19 DIAGNOSIS — G9389 Other specified disorders of brain: Secondary | ICD-10-CM | POA: Insufficient documentation

## 2014-03-19 DIAGNOSIS — Z923 Personal history of irradiation: Secondary | ICD-10-CM | POA: Insufficient documentation

## 2014-03-19 DIAGNOSIS — C50919 Malignant neoplasm of unspecified site of unspecified female breast: Secondary | ICD-10-CM | POA: Diagnosis present

## 2014-03-19 DIAGNOSIS — E049 Nontoxic goiter, unspecified: Secondary | ICD-10-CM | POA: Diagnosis not present

## 2014-03-19 DIAGNOSIS — K769 Liver disease, unspecified: Secondary | ICD-10-CM | POA: Insufficient documentation

## 2014-03-19 DIAGNOSIS — I639 Cerebral infarction, unspecified: Secondary | ICD-10-CM | POA: Insufficient documentation

## 2014-03-19 DIAGNOSIS — N261 Atrophy of kidney (terminal): Secondary | ICD-10-CM | POA: Diagnosis not present

## 2014-03-19 DIAGNOSIS — C7951 Secondary malignant neoplasm of bone: Secondary | ICD-10-CM | POA: Diagnosis not present

## 2014-03-19 DIAGNOSIS — G319 Degenerative disease of nervous system, unspecified: Secondary | ICD-10-CM | POA: Diagnosis not present

## 2014-03-19 DIAGNOSIS — I2510 Atherosclerotic heart disease of native coronary artery without angina pectoris: Secondary | ICD-10-CM | POA: Insufficient documentation

## 2014-03-19 DIAGNOSIS — I517 Cardiomegaly: Secondary | ICD-10-CM | POA: Insufficient documentation

## 2014-03-19 DIAGNOSIS — C50912 Malignant neoplasm of unspecified site of left female breast: Secondary | ICD-10-CM

## 2014-03-19 LAB — GLUCOSE, CAPILLARY: Glucose-Capillary: 88 mg/dL (ref 70–99)

## 2014-03-19 MED ORDER — FLUDEOXYGLUCOSE F - 18 (FDG) INJECTION
8.3500 | Freq: Once | INTRAVENOUS | Status: AC | PRN
  Administered 2014-03-19: 8.35 via INTRAVENOUS

## 2014-03-20 ENCOUNTER — Telehealth: Payer: Self-pay | Admitting: Nurse Practitioner

## 2014-03-20 NOTE — Telephone Encounter (Addendum)
-----   Message from Volanda Napoleon, MD sent at 03/19/2014  5:18 PM EST ----- Please call her daughter and let her know that the PET scan looks much better. The radiation really did a good job in shrinking the breast cancer. Thanks. Pete  Pt verbalized appreciation and understanding.

## 2014-03-24 ENCOUNTER — Encounter: Payer: Self-pay | Admitting: Hematology & Oncology

## 2014-03-24 ENCOUNTER — Ambulatory Visit (HOSPITAL_BASED_OUTPATIENT_CLINIC_OR_DEPARTMENT_OTHER): Payer: Medicare Other | Admitting: Hematology & Oncology

## 2014-03-24 ENCOUNTER — Ambulatory Visit (HOSPITAL_BASED_OUTPATIENT_CLINIC_OR_DEPARTMENT_OTHER): Payer: Medicare Other

## 2014-03-24 ENCOUNTER — Other Ambulatory Visit (HOSPITAL_BASED_OUTPATIENT_CLINIC_OR_DEPARTMENT_OTHER): Payer: Medicare Other | Admitting: Lab

## 2014-03-24 VITALS — BP 95/50 | HR 93 | Temp 97.9°F | Resp 20 | Ht 66.0 in | Wt 169.0 lb

## 2014-03-24 DIAGNOSIS — Z5111 Encounter for antineoplastic chemotherapy: Secondary | ICD-10-CM

## 2014-03-24 DIAGNOSIS — I482 Chronic atrial fibrillation: Secondary | ICD-10-CM

## 2014-03-24 DIAGNOSIS — C50912 Malignant neoplasm of unspecified site of left female breast: Secondary | ICD-10-CM

## 2014-03-24 LAB — CBC WITH DIFFERENTIAL (CANCER CENTER ONLY)
BASO#: 0 10*3/uL (ref 0.0–0.2)
BASO%: 1.3 % (ref 0.0–2.0)
EOS ABS: 0 10*3/uL (ref 0.0–0.5)
EOS%: 0.4 % (ref 0.0–7.0)
HCT: 33.1 % — ABNORMAL LOW (ref 34.8–46.6)
HEMOGLOBIN: 10.9 g/dL — AB (ref 11.6–15.9)
LYMPH#: 0.7 10*3/uL — ABNORMAL LOW (ref 0.9–3.3)
LYMPH%: 29.2 % (ref 14.0–48.0)
MCH: 34.7 pg — ABNORMAL HIGH (ref 26.0–34.0)
MCHC: 32.9 g/dL (ref 32.0–36.0)
MCV: 105 fL — ABNORMAL HIGH (ref 81–101)
MONO#: 0.2 10*3/uL (ref 0.1–0.9)
MONO%: 6.6 % (ref 0.0–13.0)
NEUT%: 62.5 % (ref 39.6–80.0)
NEUTROS ABS: 1.4 10*3/uL — AB (ref 1.5–6.5)
Platelets: 228 10*3/uL (ref 145–400)
RBC: 3.14 10*6/uL — AB (ref 3.70–5.32)
RDW: 20.1 % — ABNORMAL HIGH (ref 11.1–15.7)
WBC: 2.3 10*3/uL — AB (ref 3.9–10.0)

## 2014-03-24 LAB — CMP (CANCER CENTER ONLY)
ALBUMIN: 3.3 g/dL (ref 3.3–5.5)
ALK PHOS: 38 U/L (ref 26–84)
ALT(SGPT): 13 U/L (ref 10–47)
AST: 19 U/L (ref 11–38)
BUN, Bld: 12 mg/dL (ref 7–22)
CALCIUM: 8.5 mg/dL (ref 8.0–10.3)
CHLORIDE: 105 meq/L (ref 98–108)
CO2: 29 mEq/L (ref 18–33)
Creat: 1.1 mg/dl (ref 0.6–1.2)
Glucose, Bld: 98 mg/dL (ref 73–118)
POTASSIUM: 4 meq/L (ref 3.3–4.7)
SODIUM: 146 meq/L — AB (ref 128–145)
TOTAL PROTEIN: 6.8 g/dL (ref 6.4–8.1)
Total Bilirubin: 0.7 mg/dl (ref 0.20–1.60)

## 2014-03-24 MED ORDER — FULVESTRANT 250 MG/5ML IM SOLN
500.0000 mg | Freq: Once | INTRAMUSCULAR | Status: AC
  Administered 2014-03-24: 500 mg via INTRAMUSCULAR
  Filled 2014-03-24: qty 10

## 2014-03-24 MED ORDER — FULVESTRANT 250 MG/5ML IM SOLN
INTRAMUSCULAR | Status: AC
  Filled 2014-03-24: qty 10

## 2014-03-24 NOTE — Patient Instructions (Signed)
Fulvestrant injection  What is this medicine?  FULVESTRANT (ful VES trant) blocks the effects of estrogen. It is used to treat breast cancer in women past the age of menopause.  This medicine may be used for other purposes; ask your health care provider or pharmacist if you have questions.  COMMON BRAND NAME(S): FASLODEX  What should I tell my health care provider before I take this medicine?  They need to know if you have any of these conditions:  -bleeding problems  -liver disease  -low levels of platelets in the blood  -an unusual or allergic reaction to fulvestrant, other medicines, foods, dyes, or preservatives  -pregnant or trying to get pregnant  -breast-feeding  How should I use this medicine?  This medicine is for injection into a muscle. It is usually given by a health care professional in a hospital or clinic setting.  Talk to your pediatrician regarding the use of this medicine in children. Special care may be needed.  Overdosage: If you think you have taken too much of this medicine contact a poison control center or emergency room at once.  NOTE: This medicine is only for you. Do not share this medicine with others.  What if I miss a dose?  It is important not to miss your dose. Call your doctor or health care professional if you are unable to keep an appointment.  What may interact with this medicine?  -medicines that treat or prevent blood clots like warfarin, enoxaparin, and dalteparin  This list may not describe all possible interactions. Give your health care provider a list of all the medicines, herbs, non-prescription drugs, or dietary supplements you use. Also tell them if you smoke, drink alcohol, or use illegal drugs. Some items may interact with your medicine.  What should I watch for while using this medicine?  Your condition will be monitored carefully while you are receiving this medicine. You will need important blood work done while you are taking this medicine.  Do not become pregnant  while taking this medicine. Women should inform their doctor if they wish to become pregnant or think they might be pregnant. There is a potential for serious side effects to an unborn child. Talk to your health care professional or pharmacist for more information.  What side effects may I notice from receiving this medicine?  Side effects that you should report to your doctor or health care professional as soon as possible:  -allergic reactions like skin rash, itching or hives, swelling of the face, lips, or tongue  -feeling faint or lightheaded, falls  -fever or flu-like symptoms  -sore throat  -vaginal bleeding  Side effects that usually do not require medical attention (report to your doctor or health care professional if they continue or are bothersome):  -aches, pains  -constipation or diarrhea  -headache  -hot flashes  -nausea, vomiting  -pain at site where injected  -stomach pain  This list may not describe all possible side effects. Call your doctor for medical advice about side effects. You may report side effects to FDA at 1-800-FDA-1088.  Where should I keep my medicine?  This drug is given in a hospital or clinic and will not be stored at home.  NOTE: This sheet is a summary. It may not cover all possible information. If you have questions about this medicine, talk to your doctor, pharmacist, or health care provider.   2015, Elsevier/Gold Standard. (2007-08-12 15:39:24)

## 2014-03-24 NOTE — Progress Notes (Signed)
Hematology and Oncology Follow Up Visit  Erin Beasley [id number] 09/07/1928 80 y.o. 03/24/2014   Principle Diagnosis:   Metastatic breast cancer-ER positive  CVA with residual right-sided weakness  Chronic atrial fibrillation  Current Therapy:   Faslodex 500 mg IM every month Ibrance 100 mg by mouth  (21/7)     Interim History:  [name] is back for followup. She is doing pretty well. She had a repeat PET scan done. This showed resolution of the left chest wall mass. She also has resolution of the left supraclavicular lymph nodes. There is some increase in activity in the right shoulder area. Nothing was seen and corresponded on the CT scan. Otherwise, everything looks okay.  Her last CA 27.29 in October was down to 53.  She now is on [place]. She is tolerating this pretty well. Her blood counts have gone down a little bit but she is asymptomatic.  She's had no fever. She's had no bleeding. She's on Coumadin for the atrial fibrillation. There's been no problems with her bowels or bladder. She's had no leg swelling.  There is still some weakness over on the right side. This is chronic.  Her appetite has been doing very well. She has had no nausea or vomiting. She has had no change in her appetite. She's not noted any count of rashes.  Apparently, her neurologist change some medications on her. She does seem to be a little bit more emotional and talkative.  Medications: Current outpatient prescriptions: calcium carbonate (TUMS - DOSED IN MG ELEMENTAL CALCIUM) 500 MG chewable tablet, Chew 1 tablet by mouth daily., Disp: , Rfl: ;  Cholecalciferol (VITAMIN D PO), Take by mouth every morning. , Disp: , Rfl: ;  diltiazem (CARDIZEM CD) 120 MG 24 hr capsule, Take 1 capsule (120 mg total) by mouth daily., Disp: 30 capsule, Rfl: 3;  donepezil (ARICEPT) 5 MG tablet, Take 5 mg by mouth at bedtime. , Disp: , Rfl: 11 famotidine (PEPCID) 20 MG tablet, Take 20 mg by mouth daily. , Disp: ,  Rfl: ;  Fulvestrant (FASLODEX IM), Inject into the muscle every 30 (thirty) days., Disp: , Rfl: ;  metoprolol (LOPRESSOR) 50 MG tablet, Take 50 mg by mouth 2 (two) times daily. , Disp: , Rfl: ;  palbociclib (IBRANCE) 100 MG capsule, Take 100 mg by mouth as directed. Take whole with food. ON 21 DAYS AND OFF 7 DAYS AND REPEAT., Disp: , Rfl:  simvastatin (ZOCOR) 20 MG tablet, Take 20 mg by mouth at bedtime. , Disp: , Rfl: ;  warfarin (COUMADIN) 5 MG tablet, Take 5 mg by mouth daily. ONLY  TAKES ON T-T-S-S NO OTHER DAYS, Disp: , Rfl:  No current facility-administered medications for this visit. Facility-Administered Medications Ordered in Other Visits: Influenza vac split quadrivalent PF (FLUARIX) injection 0.5 mL, 0.5 mL, Intramuscular, Once, Volanda Napoleon, MD  Allergies: No Known Allergies  Past Medical History, Surgical history, Social history, and Family History were reviewed and updated.  Review of Systems: As above  Physical Exam:  height is 5\' 6"  (1.676 m) and weight is 169 lb (76.658 kg). Her oral temperature is 97.9 F (36.6 C). Her blood pressure is 95/50 and her pulse is 93. Her respiration is 20.   [name]. She has no lymphadenopathy in the neck. Lungs are clear. Cardiac exam irregular rate and rhythm consistent with atrial fibrillation. She has no murmurs. Chest wall exam shows marked improvement of the the left chest wall lesion. There  is no swelling. There is no exudate. There is no bloody discharge. . Abdomen is soft. She has good bowel sounds. There is no palpable liver or spleen. Back exam no tenderness over the spine. Extremities shows no clubbing, cyanosis or edema. She has continued weakness over on the right side. Skin exam no rashes. Lab Results  Component Value Date   WBC 2.3* 03/24/2014   HGB 10.9* 03/24/2014   HCT 33.1* 03/24/2014   MCV 105* 03/24/2014   PLT 228 03/24/2014     Chemistry      Component Value Date/Time   NA 146* 03/24/2014  1032   NA 142 01/09/2014 1211   NA 141 01/12/2012 1332   K 4.0 03/24/2014 1032   K 3.9 01/09/2014 1211   K 4.5 01/12/2012 1332   CL 105 03/24/2014 1032   CL 105 01/09/2014 1211   CL 106 01/12/2012 1332   CO2 29 03/24/2014 1032   CO2 27 01/09/2014 1211   CO2 26 01/12/2012 1332   BUN 12 03/24/2014 1032   BUN 18 01/09/2014 1211   BUN 19.0 01/12/2012 1332   CREATININE 1.1 03/24/2014 1032   CREATININE 1.48* 01/09/2014 1211   CREATININE 1.1 01/12/2012 1332      Component Value Date/Time   CALCIUM 8.5 03/24/2014 1032   CALCIUM 8.9 01/09/2014 1211   CALCIUM 9.6 01/12/2012 1332   ALKPHOS 38 03/24/2014 1032   ALKPHOS 57 01/09/2014 1211   ALKPHOS 69 01/12/2012 1332   AST 19 03/24/2014 1032   AST 16 01/09/2014 1211   AST 25 01/12/2012 1332   ALT 13 03/24/2014 1032   ALT 10 01/09/2014 1211   ALT 18 01/12/2012 1332   BILITOT 0.70 03/24/2014 1032   BILITOT 0.6 01/09/2014 1211   BILITOT 0.60 01/12/2012 1332         Impression and Plan: Erin Beasley is a 80 year old female. She has metastatic breast cancer. She is responding nicely. The PET scan confirms this. Her physical exam confirms this.  She's doing well with the Ibrance.  I think for December, we can have her take 2 weeks off. This will get her through the week of Christmas and the week of New Year's. She will then start back up the first Monday in January.  I'll plan to see her back in another 6 weeks   Volanda Napoleon, MD 12/8/201511:49 AM

## 2014-03-25 LAB — PROTHROMBIN TIME
INR: 3.16 — ABNORMAL HIGH (ref <1.50)
PROTHROMBIN TIME: 32.4 s — AB (ref 11.6–15.2)

## 2014-03-25 LAB — CANCER ANTIGEN 27.29: CA 27.29: 36 U/mL (ref 0–39)

## 2014-03-27 ENCOUNTER — Ambulatory Visit: Payer: Medicare Other | Attending: Internal Medicine | Admitting: Occupational Therapy

## 2014-03-27 ENCOUNTER — Encounter: Payer: Self-pay | Admitting: Occupational Therapy

## 2014-03-27 DIAGNOSIS — G8190 Hemiplegia, unspecified affecting unspecified side: Secondary | ICD-10-CM | POA: Diagnosis not present

## 2014-03-27 DIAGNOSIS — G8191 Hemiplegia, unspecified affecting right dominant side: Secondary | ICD-10-CM

## 2014-03-27 DIAGNOSIS — I6990 Unspecified sequelae of unspecified cerebrovascular disease: Secondary | ICD-10-CM | POA: Diagnosis present

## 2014-03-27 NOTE — Patient Instructions (Addendum)
Both pt and her family (pt's husband and daughter were present throughout assessment) state that she has no significant changes in her ability to perform ADL's and self care tasks since initial CVA in 2000. Pt's daughter states that pt's MD was concerned with pt's fingernails being too long and due her level of tone, that they were pressing into her palm. For this reason, Pt was educated in use of palm protector right hand and increasing her tolerance in wearing. Pt was educated to wear in 15-20 minute intervals and increasing the amount of time that she is in it over the next several days/weeks. Pt/family verbalized understanding and agreement of this. They were educated verbally in use, care and precautions of palm protector and verbalized understanding.  They report no other needs at this time and are in agreement for Evaluation only today and not to continue with treatment and a treatment plan as they feel that she is at her baseline level since 2000. Pt/family state that family is able to assist her PRN and they have no other needs at this time. Will sign off out-pt OT at this time, Evaluation only.

## 2014-03-27 NOTE — Therapy (Signed)
[facility] [address] [place], [place], [address] Phone: [phone]   Fax:  [phone]  Occupational Therapy Evaluation  Patient Details  Name: Erin Beasley MRN: [id number] Date of Birth: [birth year]  Encounter Date: 03/27/2014 (80)      OT End of Session - 03/27/14 1311    Number of Visits --  1/10 G   Date for OT Re-Evaluation 04/24/14   Authorization Type Eval only performed today, will keep pt chart open for a few weeks in case they have any questions/concerns w/ palm protector use. They verbalized agreement and understanding with this.   OT Start Time [phone]   OT Stop Time 0840   OT Time Calculation (min) 44 min   Equipment Utilized During [employer] protector issued R UE   Activity Tolerance Patient tolerated treatment well   Behavior During Therapy WFL for tasks assessed/performed      Past Medical History  Diagnosis Date  . Stroke 1989  . Diverticulitis   . Cancer 2008, 2013    breast   . 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 Radiation 12/10/13-12/24/13    Left lateral chest wall mass 30 gray    Past Surgical History  Procedure Laterality Date  . Colon surgery    . Breast surgery      left mrm  . Lymph node dissection Left 12/31/2003    axillary  . Mastectomy Left   . Punch biopsy of skin  02/09/2012    left chest wall    There were no vitals taken for this visit.  Visit Diagnosis:  Late effects of CVA (cerebrovascular accident) - Plan: Ot plan of care cert/re-cert  Right hemiplegia - Plan: Ot plan of care cert/re-cert      Subjective Assessment - 03/27/14 0804    Symptoms Pt's daughter states that her mother had a CVA in 2000 resulting in R UE weakness/hemiplegia. She states that she initially had home helath therapy and at some point stopped this therapy due to RUE pain. She presents today with no c/o pain, however her daughter stated that her finger nails occasionally are long may dig  into palm of her right hand. She other wise has no c/o. She is currently receiving treatment for breast cancer that is metastatic.   Currently in Pain? No/denies   Multiple Pain Sites No          [facility] OT Assessment - 03/27/14 0001    Assessment   Diagnosis L CVA 2000  RUE hemiplegia   Onset Date --  2000   Prior Therapy --  Pt had therapy in hospital and then home health and disconti   Precautions   Precautions None   Required Braces or Orthoses --  Pt initially had a splint, but has not worn for years   Restrictions   Weight Bearing Restrictions No   Balance Screen   Has the patient fallen in the past 6 months No   Has the patient had a decrease in activity level because of a fear of falling?  No   Is the patient reluctant to leave their home because of a fear of falling?  No   Home  Environment   Family/patient expects to be discharged to: Private residence   Living Arrangements Spouse/significant other   Type of [place] One level   [place] - Number of Steps --  1 STE   Bathroom  Shower/Tub Tub/Shower Physiological scientist Handicapped height  Also uses 3:1 in her room   Bathroom Accessibility Yes   ADL   Eating/Feeding Minimal assistance  Cannot use right hand to pick up food   Grooming Minimal assistance   Upper Body Bathing Minimal assistance   Lower Body Bathing Minimal assistance   Upper Body Dressing Minimal assistance   Lower Body Dressing Minimal assistance   Toilet Tranfer Modified independent   Toileting - Clothing Manipulation Modified independent   Tub/Shower Transfer Minimal assistance   ADL comments Pt is over all Min A  ADL's and self care tasks  She is Mod I toileting and clothing management using 3:1   IADL   Light Housekeeping Needs help with all home maintenance tasks  "I direct" tells family what to do and when   Mobility    Mobility Status Independent  Ambulates w/o AD   Mobility Status Comments Pt with decreased gait/right side   Written Expression   Dominant Hand Right  Pt does not use right hand as dominant hand since 2000   Handwriting --  Unable, family signs documents   Vision - History   Baseline Vision No visual deficits   Visual History --  Has had cataract surgery in past, does not use glasses   Cognition   Overall Cognitive Status Within Functional Limits for tasks assessed   Memory Impaired   Memory Impairment Decreased short term memory  Family assists with STM    Observation/Other Assessments   Observations R hand in flexed position, digits in palm   Sensation   Light Touch Appears Intact   Coordination   Gross Motor Movements are Fluid and Coordinated No   Fine Motor Movements are Fluid and Coordinated No   Coordination and Movement Description Unable/no AROM   Coordination Unable, No AROM   AROM   Overall AROM  Unable to assess  No AROM   PROM   Overall PROM  Deficits  Impaired due to tone and no functional use since 2000    Both pt and her family (pt's husband and daughter were present throughout assessment) state that she has no significant changes in her ability to perform ADL's and self care tasks since initial CVA in 2000. Pt's daughter states that pt's MD was concerned with pt's fingernails being too long and due her level of tone, that they were pressing into her palm. For this reason, Pt was educated in use of palm protector right hand and increasing her tolerance in wearing. Pt was educated to wear in 15-20 minute intervals and increasing the amount of time that she is in it over the next several days/weeks. Pt/family verbalized understanding and agreement of this. They were educated verbally in use, care and precautions of palm protector and verbalized understanding.  They report no other needs at this time and are in agreement for Evaluation only today and not to continue  with treatment and a treatment plan as they feel that she is at her baseline level since 2000. Pt/family state that family is able to assist her PRN and they have no other needs at this time. Will sign off out-pt OT at this time, Evaluation only.        OT Education - 03/27/14 [phone]    Education provided Yes   Education Details Use of palm protector RUE/hand   Person(s) Educated Patient;Spouse;Child(ren)   Methods Explanation;Demonstration;Verbal cues   Comprehension Verbalized understanding;Returned  demonstration          OT Short Term Goals - 03/30/2014 1323    OT SHORT TERM GOAL #1   Title Eval only: Pt/family will be Mod I right hand palm protector use,care and precautions & are allowed up to 1-3 visits should they have any questions/concerns.   Time 3   Period Weeks   Status New            Plan - 2014/03/30 1314    Clinical Impression Statement Pt is a pleasant 80 y/o female dx CVA w/ R hemiplegia in 2000. She and her family state that she has not had a change in status re: level of care, ADL's etc, but that her primary care MD was concerned with her fingernails possibly pressing  into her palm and wanting to avoid a pressure area. They report that she has no other needs at this time and wish for evaluation only/no treatment as her family is able to assist her as needed at home for all ADL/IADL needs. She was fitted with a right UE palm protector today and she and her family were educated in use, care and precautions. They verbalized understanding of this and were Mod I assisting her in/out of palm protector. Will sign off out-pt OT at this time (will leave chart open for several weeks in case family/pt have any questions/concerns).   OT Frequency Other (comment)  Evaluation only   OT Duration Other (comment)  Evaluation only   OT Treatment/Interventions Patient/family education;Other (comment)  Palm protector use, care and precautions   Plan Issued palm protector today and  pt/family were educated in use, care and precautions & verbalized understanding. They report no further OT needs at this time. Her chart will remain open for several weeks should pt have any questions/concerns or need adjustments.   Recommended Other Services F/U w/ primary MD PRN   Consulted and Agree with Plan of Care Patient;Family member/caregiver   Family Member Consulted Spouse and daughter.          G-Codes - 03/30/14 1326    Functional Assessment Tool Used Clinical judgement   Functional Limitation Carrying, moving and handling objects   Carrying, Moving and Handling Objects Current Status [phone]) At least 80 percent but less than 100 percent impaired, limited or restricted   Carrying, Moving and Handling Objects Goal Status (Y1856) At least 80 percent but less than 100 percent impaired, limited or restricted                             Problem List Patient Active Problem List   Diagnosis Date Noted  . Breast cancer 01/23/2012    Almyra Deforest, OT 03/30/2014, 1:39 PM

## 2014-05-01 ENCOUNTER — Encounter: Payer: Self-pay | Admitting: Hematology & Oncology

## 2014-05-01 ENCOUNTER — Ambulatory Visit (HOSPITAL_BASED_OUTPATIENT_CLINIC_OR_DEPARTMENT_OTHER): Payer: Medicare Other

## 2014-05-01 ENCOUNTER — Other Ambulatory Visit (HOSPITAL_BASED_OUTPATIENT_CLINIC_OR_DEPARTMENT_OTHER): Payer: Medicare Other | Admitting: Lab

## 2014-05-01 ENCOUNTER — Ambulatory Visit (HOSPITAL_BASED_OUTPATIENT_CLINIC_OR_DEPARTMENT_OTHER): Payer: Medicare Other | Admitting: Hematology & Oncology

## 2014-05-01 VITALS — BP 88/49 | HR 69 | Temp 97.5°F | Resp 16 | Ht 66.0 in | Wt 165.0 lb

## 2014-05-01 DIAGNOSIS — C50912 Malignant neoplasm of unspecified site of left female breast: Secondary | ICD-10-CM

## 2014-05-01 DIAGNOSIS — Z5111 Encounter for antineoplastic chemotherapy: Secondary | ICD-10-CM

## 2014-05-01 LAB — CMP (CANCER CENTER ONLY)
ALBUMIN: 3.3 g/dL (ref 3.3–5.5)
ALK PHOS: 48 U/L (ref 26–84)
ALT(SGPT): 10 U/L (ref 10–47)
AST: 20 U/L (ref 11–38)
BUN, Bld: 15 mg/dL (ref 7–22)
CO2: 31 mEq/L (ref 18–33)
CREATININE: 1.3 mg/dL — AB (ref 0.6–1.2)
Calcium: 9.3 mg/dL (ref 8.0–10.3)
Chloride: 101 mEq/L (ref 98–108)
Glucose, Bld: 77 mg/dL (ref 73–118)
Potassium: 4 mEq/L (ref 3.3–4.7)
Sodium: 141 mEq/L (ref 128–145)
Total Bilirubin: 0.8 mg/dl (ref 0.20–1.60)
Total Protein: 6.9 g/dL (ref 6.4–8.1)

## 2014-05-01 LAB — CANCER ANTIGEN 27.29: CA 27.29: 39 U/mL (ref 0–39)

## 2014-05-01 LAB — CBC WITH DIFFERENTIAL (CANCER CENTER ONLY)
BASO#: 0 10*3/uL (ref 0.0–0.2)
BASO%: 0.4 % (ref 0.0–2.0)
EOS%: 2.9 % (ref 0.0–7.0)
Eosinophils Absolute: 0.1 10*3/uL (ref 0.0–0.5)
HCT: 37.7 % (ref 34.8–46.6)
HGB: 12.3 g/dL (ref 11.6–15.9)
LYMPH#: 0.7 10*3/uL — ABNORMAL LOW (ref 0.9–3.3)
LYMPH%: 23.2 % (ref 14.0–48.0)
MCH: 35.9 pg — ABNORMAL HIGH (ref 26.0–34.0)
MCHC: 32.6 g/dL (ref 32.0–36.0)
MCV: 110 fL — ABNORMAL HIGH (ref 81–101)
MONO#: 0.1 10*3/uL (ref 0.1–0.9)
MONO%: 4.6 % (ref 0.0–13.0)
NEUT#: 1.9 10*3/uL (ref 1.5–6.5)
NEUT%: 68.9 % (ref 39.6–80.0)
Platelets: 220 10*3/uL (ref 145–400)
RBC: 3.43 10*6/uL — ABNORMAL LOW (ref 3.70–5.32)
RDW: 14.1 % (ref 11.1–15.7)
WBC: 2.8 10*3/uL — ABNORMAL LOW (ref 3.9–10.0)

## 2014-05-01 MED ORDER — FULVESTRANT 250 MG/5ML IM SOLN
500.0000 mg | Freq: Once | INTRAMUSCULAR | Status: AC
  Administered 2014-05-01: 500 mg via INTRAMUSCULAR

## 2014-05-01 MED ORDER — FULVESTRANT 250 MG/5ML IM SOLN
INTRAMUSCULAR | Status: AC
  Filled 2014-05-01: qty 10

## 2014-05-01 NOTE — Patient Instructions (Signed)
Fulvestrant injection  What is this medicine?  FULVESTRANT (ful VES trant) blocks the effects of estrogen. It is used to treat breast cancer in women past the age of menopause.  This medicine may be used for other purposes; ask your health care provider or pharmacist if you have questions.  COMMON BRAND NAME(S): FASLODEX  What should I tell my health care provider before I take this medicine?  They need to know if you have any of these conditions:  -bleeding problems  -liver disease  -low levels of platelets in the blood  -an unusual or allergic reaction to fulvestrant, other medicines, foods, dyes, or preservatives  -pregnant or trying to get pregnant  -breast-feeding  How should I use this medicine?  This medicine is for injection into a muscle. It is usually given by a health care professional in a hospital or clinic setting.  Talk to your pediatrician regarding the use of this medicine in children. Special care may be needed.  Overdosage: If you think you have taken too much of this medicine contact a poison control center or emergency room at once.  NOTE: This medicine is only for you. Do not share this medicine with others.  What if I miss a dose?  It is important not to miss your dose. Call your doctor or health care professional if you are unable to keep an appointment.  What may interact with this medicine?  -medicines that treat or prevent blood clots like warfarin, enoxaparin, and dalteparin  This list may not describe all possible interactions. Give your health care provider a list of all the medicines, herbs, non-prescription drugs, or dietary supplements you use. Also tell them if you smoke, drink alcohol, or use illegal drugs. Some items may interact with your medicine.  What should I watch for while using this medicine?  Your condition will be monitored carefully while you are receiving this medicine. You will need important blood work done while you are taking this medicine.  Do not become pregnant  while taking this medicine. Women should inform their doctor if they wish to become pregnant or think they might be pregnant. There is a potential for serious side effects to an unborn child. Talk to your health care professional or pharmacist for more information.  What side effects may I notice from receiving this medicine?  Side effects that you should report to your doctor or health care professional as soon as possible:  -allergic reactions like skin rash, itching or hives, swelling of the face, lips, or tongue  -feeling faint or lightheaded, falls  -fever or flu-like symptoms  -sore throat  -vaginal bleeding  Side effects that usually do not require medical attention (report to your doctor or health care professional if they continue or are bothersome):  -aches, pains  -constipation or diarrhea  -headache  -hot flashes  -nausea, vomiting  -pain at site where injected  -stomach pain  This list may not describe all possible side effects. Call your doctor for medical advice about side effects. You may report side effects to FDA at 1-800-FDA-1088.  Where should I keep my medicine?  This drug is given in a hospital or clinic and will not be stored at home.  NOTE: This sheet is a summary. It may not cover all possible information. If you have questions about this medicine, talk to your doctor, pharmacist, or health care provider.   2015, Elsevier/Gold Standard. (2007-08-12 15:39:24)

## 2014-05-11 NOTE — Progress Notes (Signed)
Hematology and Oncology Follow Up Visit  Erin Beasley [id number] 02-20-1929 81 y.o. 05/11/2014   Principle Diagnosis:   Metastatic breast cancer-ER positive  CVA with residual right-sided weakness  Chronic atrial fibrillation  Current Therapy:   Faslodex 500 mg IM every month Ibrance 100 mg by mouth  (21/7)     Interim History:  [name] is back for followup. She is doing pretty well. She had a good Christmas. She had a good New Year's.  She is tolerating the Ibrance well. There's been no nausea or vomiting. Her appetite has been doing okay.  Her last CA 27.29 in October was down to 25   She's had no fever. She's had no bleeding. She's on Coumadin for the atrial fibrillation. There's been no problems with her bowels or bladder. She's had no leg swelling.  There is still some weakness over on the right side. This is chronic.  Her appetite has been doing very well. She has had no nausea or vomiting. She has had no change in her appetite. She's not noted any count of rashes.  Apparently, her neurologist change some medications on her. She does seem to be a little bit more emotional and talkative.  Medications:  Current outpatient prescriptions:  .  calcium carbonate (TUMS - DOSED IN MG ELEMENTAL CALCIUM) 500 MG chewable tablet, Chew 1 tablet by mouth daily., Disp: , Rfl:  .  Cholecalciferol (VITAMIN D PO), Take by mouth every morning. , Disp: , Rfl:  .  diltiazem (CARDIZEM CD) 120 MG 24 hr capsule, Take 1 capsule (120 mg total) by mouth daily., Disp: 30 capsule, Rfl: 3 .  famotidine (PEPCID) 20 MG tablet, Take 20 mg by mouth daily. , Disp: , Rfl:  .  Fulvestrant (FASLODEX IM), Inject into the muscle every 30 (thirty) days., Disp: , Rfl:  .  memantine (NAMENDA) 5 MG tablet, Take 5 mg by mouth 2 (two) times daily., Disp: , Rfl: 6 .  metoprolol (LOPRESSOR) 50 MG tablet, Take 50 mg by mouth 2 (two) times daily. , Disp: , Rfl:  .  palbociclib (IBRANCE) 100 MG capsule, Take  100 mg by mouth as directed. Take whole with food. ON 21 DAYS AND OFF 7 DAYS AND REPEAT., Disp: , Rfl:  .  simvastatin (ZOCOR) 20 MG tablet, Take 20 mg by mouth at bedtime. , Disp: , Rfl:  .  warfarin (COUMADIN) 5 MG tablet, Take 5 mg by mouth daily. ONLY  TAKES ON T-T-S-S NO OTHER DAYS, Disp: , Rfl:  No current facility-administered medications for this visit.  Facility-Administered Medications Ordered in Other Visits:  .  Influenza vac split quadrivalent PF (FLUARIX) injection 0.5 mL, 0.5 mL, Intramuscular, Once, Volanda Napoleon, MD  Allergies: No Known Allergies  Past Medical History, Surgical history, Social history, and Family History were reviewed and updated.  Review of Systems: As above  Physical Exam:  height is 5\' 6"  (1.676 m) and weight is 165 lb (74.844 kg). Her oral temperature is 97.5 F (36.4 C). Her blood pressure is 88/49 and her pulse is 69. Her respiration is 16.   [name]. She has no lymphadenopathy in the neck. Lungs are clear. Cardiac exam irregular rate and rhythm consistent with atrial fibrillation. She has no murmurs. Chest wall exam shows marked improvement of the the left chest wall lesion. There is no swelling. There is no exudate. There is no bloody discharge. . Abdomen is soft. She has good bowel sounds. There is no  palpable liver or spleen. Back exam no tenderness over the spine. Extremities shows no clubbing, cyanosis or edema. She has continued weakness over on the right side. Skin exam no rashes. Lab Results  Component Value Date   WBC 2.8* 05/01/2014   HGB 12.3 05/01/2014   HCT 37.7 05/01/2014   MCV 110* 05/01/2014   PLT 220 05/01/2014     Chemistry      Component Value Date/Time   NA 141 05/01/2014 1002   NA 142 01/09/2014 1211   NA 141 01/12/2012 1332   K 4.0 05/01/2014 1002   K 3.9 01/09/2014 1211   K 4.5 01/12/2012 1332   CL 101 05/01/2014 1002   CL 105 01/09/2014 1211   CL 106 01/12/2012 1332   CO2 31 05/01/2014  1002   CO2 27 01/09/2014 1211   CO2 26 01/12/2012 1332   BUN 15 05/01/2014 1002   BUN 18 01/09/2014 1211   BUN 19.0 01/12/2012 1332   CREATININE 1.3* 05/01/2014 1002   CREATININE 1.48* 01/09/2014 1211   CREATININE 1.1 01/12/2012 1332      Component Value Date/Time   CALCIUM 9.3 05/01/2014 1002   CALCIUM 8.9 01/09/2014 1211   CALCIUM 9.6 01/12/2012 1332   ALKPHOS 48 05/01/2014 1002   ALKPHOS 57 01/09/2014 1211   ALKPHOS 69 01/12/2012 1332   AST 20 05/01/2014 1002   AST 16 01/09/2014 1211   AST 25 01/12/2012 1332   ALT 10 05/01/2014 1002   ALT 10 01/09/2014 1211   ALT 18 01/12/2012 1332   BILITOT 0.80 05/01/2014 1002   BILITOT 0.6 01/09/2014 1211   BILITOT 0.60 01/12/2012 1332         Impression and Plan: Erin Beasley is a 81 year old female. She has metastatic breast cancer. She is responding nicely. The PET scan she had in December confirms this. Her physical exam confirms this.  She's doing well with the Ibrance.  Her CA 27.29 is holding steady at 39.  We will plan to get her back in another month or so. I don't think we need another PET scan until March.     Volanda Napoleon, MD 1/25/20167:06 AM

## 2014-05-21 ENCOUNTER — Telehealth: Payer: Self-pay | Admitting: Hematology & Oncology

## 2014-05-21 NOTE — Telephone Encounter (Signed)
Congratulations! Awarded a 2nd Radio producer!! Based on the information you have provided, you have qualified for PAN assistance Here is the information you will need to process a claim through your pharmacy: Patient Status:Approval for Metastatic Breast Cancer - Medicare  Available Balance:$7500.00  Enrollment Start Date: 05/21/2014  Enrollment End Date: 05/21/2015  NAME:Erin Beasley  PF:[id number]  [id number]  UXYBF:[id number] The prescription program is NOT an insurance benefit  You will receive a pharmacy benefits card by mail that lists your ID number, Rx Group number and Rx BIN number. Take the card with you to the pharmacy where it will be used for payment. Pharmacies with the capability to bill secondary or tertiary claims electronically can submit claims directly to the [employer]. If you don't have your benefit card, the pharmacy can call toll-free at [phone]) extension [phone] for electronic billing assistance, which is available Monday through Friday, 9 a.m. to 5 p.m. Eastern Time.  Now that you are enrolled, we invite you to register for the PAN Patient Portal. The PAN Patient Portal will allow you to access your grant balance and enrollment status 24 hours a day, seven days a week.   IBRANCE 100mg 

## 2014-05-25 ENCOUNTER — Telehealth: Payer: Self-pay | Admitting: Nurse Practitioner

## 2014-05-25 NOTE — Telephone Encounter (Signed)
Received notification that the PANF allocated additional money for [place]. The grant amt has been updated to $12,000-Medicare only. The reimbursement is avail for 09/13/13-12/12/2014. Her new balance is now $16500.00/ Ref # [id number].

## 2014-05-26 ENCOUNTER — Other Ambulatory Visit: Payer: Self-pay | Admitting: Hematology & Oncology

## 2014-06-08 ENCOUNTER — Ambulatory Visit (HOSPITAL_BASED_OUTPATIENT_CLINIC_OR_DEPARTMENT_OTHER): Payer: Medicare Other

## 2014-06-08 ENCOUNTER — Encounter: Payer: Self-pay | Admitting: Hematology & Oncology

## 2014-06-08 ENCOUNTER — Ambulatory Visit (HOSPITAL_BASED_OUTPATIENT_CLINIC_OR_DEPARTMENT_OTHER): Payer: Medicare Other | Admitting: Hematology & Oncology

## 2014-06-08 ENCOUNTER — Other Ambulatory Visit (HOSPITAL_BASED_OUTPATIENT_CLINIC_OR_DEPARTMENT_OTHER): Payer: Medicare Other | Admitting: Lab

## 2014-06-08 VITALS — BP 99/53 | HR 88 | Temp 97.7°F | Resp 16 | Ht 66.0 in | Wt 167.0 lb

## 2014-06-08 DIAGNOSIS — Z5111 Encounter for antineoplastic chemotherapy: Secondary | ICD-10-CM

## 2014-06-08 DIAGNOSIS — C50912 Malignant neoplasm of unspecified site of left female breast: Secondary | ICD-10-CM

## 2014-06-08 LAB — CMP (CANCER CENTER ONLY)
ALBUMIN: 3.4 g/dL (ref 3.3–5.5)
ALT(SGPT): 12 U/L (ref 10–47)
AST: 23 U/L (ref 11–38)
Alkaline Phosphatase: 55 U/L (ref 26–84)
BILIRUBIN TOTAL: 0.7 mg/dL (ref 0.20–1.60)
BUN: 16 mg/dL (ref 7–22)
CHLORIDE: 104 meq/L (ref 98–108)
CO2: 30 mEq/L (ref 18–33)
CREATININE: 1 mg/dL (ref 0.6–1.2)
Calcium: 9.3 mg/dL (ref 8.0–10.3)
Glucose, Bld: 78 mg/dL (ref 73–118)
POTASSIUM: 4.1 meq/L (ref 3.3–4.7)
Sodium: 147 mEq/L — ABNORMAL HIGH (ref 128–145)
Total Protein: 7.1 g/dL (ref 6.4–8.1)

## 2014-06-08 LAB — CANCER ANTIGEN 27.29: CA 27.29: 45 U/mL — AB (ref 0–39)

## 2014-06-08 LAB — CBC WITH DIFFERENTIAL (CANCER CENTER ONLY)
BASO#: 0 10*3/uL (ref 0.0–0.2)
BASO%: 1 % (ref 0.0–2.0)
EOS ABS: 0 10*3/uL (ref 0.0–0.5)
EOS%: 0.8 % (ref 0.0–7.0)
HCT: 37.4 % (ref 34.8–46.6)
HEMOGLOBIN: 12.2 g/dL (ref 11.6–15.9)
LYMPH#: 1 10*3/uL (ref 0.9–3.3)
LYMPH%: 25.4 % (ref 14.0–48.0)
MCH: 35.6 pg — ABNORMAL HIGH (ref 26.0–34.0)
MCHC: 32.6 g/dL (ref 32.0–36.0)
MCV: 109 fL — AB (ref 81–101)
MONO#: 0.4 10*3/uL (ref 0.1–0.9)
MONO%: 10.1 % (ref 0.0–13.0)
NEUT#: 2.4 10*3/uL (ref 1.5–6.5)
NEUT%: 62.7 % (ref 39.6–80.0)
PLATELETS: 193 10*3/uL (ref 145–400)
RBC: 3.43 10*6/uL — ABNORMAL LOW (ref 3.70–5.32)
RDW: 13.1 % (ref 11.1–15.7)
WBC: 3.9 10*3/uL (ref 3.9–10.0)

## 2014-06-08 MED ORDER — FULVESTRANT 250 MG/5ML IM SOLN
500.0000 mg | Freq: Once | INTRAMUSCULAR | Status: AC
  Administered 2014-06-08: 500 mg via INTRAMUSCULAR

## 2014-06-08 MED ORDER — FULVESTRANT 250 MG/5ML IM SOLN
INTRAMUSCULAR | Status: AC
  Filled 2014-06-08: qty 5

## 2014-06-08 NOTE — Patient Instructions (Signed)
Fulvestrant injection  What is this medicine?  FULVESTRANT (ful VES trant) blocks the effects of estrogen. It is used to treat breast cancer in women past the age of menopause.  This medicine may be used for other purposes; ask your health care provider or pharmacist if you have questions.  COMMON BRAND NAME(S): FASLODEX  What should I tell my health care provider before I take this medicine?  They need to know if you have any of these conditions:  -bleeding problems  -liver disease  -low levels of platelets in the blood  -an unusual or allergic reaction to fulvestrant, other medicines, foods, dyes, or preservatives  -pregnant or trying to get pregnant  -breast-feeding  How should I use this medicine?  This medicine is for injection into a muscle. It is usually given by a health care professional in a hospital or clinic setting.  Talk to your pediatrician regarding the use of this medicine in children. Special care may be needed.  Overdosage: If you think you have taken too much of this medicine contact a poison control center or emergency room at once.  NOTE: This medicine is only for you. Do not share this medicine with others.  What if I miss a dose?  It is important not to miss your dose. Call your doctor or health care professional if you are unable to keep an appointment.  What may interact with this medicine?  -medicines that treat or prevent blood clots like warfarin, enoxaparin, and dalteparin  This list may not describe all possible interactions. Give your health care provider a list of all the medicines, herbs, non-prescription drugs, or dietary supplements you use. Also tell them if you smoke, drink alcohol, or use illegal drugs. Some items may interact with your medicine.  What should I watch for while using this medicine?  Your condition will be monitored carefully while you are receiving this medicine. You will need important blood work done while you are taking this medicine.  Do not become pregnant  while taking this medicine. Women should inform their doctor if they wish to become pregnant or think they might be pregnant. There is a potential for serious side effects to an unborn child. Talk to your health care professional or pharmacist for more information.  What side effects may I notice from receiving this medicine?  Side effects that you should report to your doctor or health care professional as soon as possible:  -allergic reactions like skin rash, itching or hives, swelling of the face, lips, or tongue  -feeling faint or lightheaded, falls  -fever or flu-like symptoms  -sore throat  -vaginal bleeding  Side effects that usually do not require medical attention (report to your doctor or health care professional if they continue or are bothersome):  -aches, pains  -constipation or diarrhea  -headache  -hot flashes  -nausea, vomiting  -pain at site where injected  -stomach pain  This list may not describe all possible side effects. Call your doctor for medical advice about side effects. You may report side effects to FDA at 1-800-FDA-1088.  Where should I keep my medicine?  This drug is given in a hospital or clinic and will not be stored at home.  NOTE: This sheet is a summary. It may not cover all possible information. If you have questions about this medicine, talk to your doctor, pharmacist, or health care provider.   2015, Elsevier/Gold Standard. (2007-08-12 15:39:24)

## 2014-06-08 NOTE — Progress Notes (Signed)
Hematology and Oncology Follow Up Visit  Erin Beasley [id number] 1929-03-25 81 y.o. 06/08/2014   Principle Diagnosis:   Metastatic breast cancer-ER positive  CVA with residual right-sided weakness  Chronic atrial fibrillation  Current Therapy:   Faslodex 500 mg IM every month Ibrance 100 mg by mouth  (21/7)     Interim History:  [name] is back for followup. She is doing pretty well. She is eating well. She's not having any issues with respect to bleeding. She is on Coumadin.  She's had no problems with the Ibrance. She will start her next cycle tomorrow.  Her last CA 27.29 in January was 61. This is holding pretty steady.  She's not noted any issues with respect to the CVA. She still has the right-sided weakness.  She still is a little bit agitated. This probably is from medications. Apparently, her neurologist change some medications on her. She does seem to be a little bit more emotional and talkative.  Medications:  Current outpatient prescriptions:  .  calcium carbonate (TUMS - DOSED IN MG ELEMENTAL CALCIUM) 500 MG chewable tablet, Chew 1 tablet by mouth daily., Disp: , Rfl:  .  Cholecalciferol (VITAMIN D PO), Take by mouth every morning. , Disp: , Rfl:  .  diltiazem (CARDIZEM CD) 120 MG 24 hr capsule, TAKE ONE CAPSULE BY MOUTH EVERY DAY, Disp: 30 capsule, Rfl: 3 .  famotidine (PEPCID) 20 MG tablet, Take 20 mg by mouth daily. , Disp: , Rfl:  .  Fulvestrant (FASLODEX IM), Inject into the muscle every 30 (thirty) days., Disp: , Rfl:  .  memantine (NAMENDA) 5 MG tablet, Take 5 mg by mouth 2 (two) times daily., Disp: , Rfl: 6 .  metoprolol (LOPRESSOR) 50 MG tablet, Take 50 mg by mouth 2 (two) times daily. , Disp: , Rfl:  .  palbociclib (IBRANCE) 100 MG capsule, Take 100 mg by mouth as directed. Take whole with food. ON 21 DAYS AND OFF 7 DAYS AND REPEAT., Disp: , Rfl:  .  simvastatin (ZOCOR) 20 MG tablet, Take 20 mg by mouth at bedtime. , Disp: , Rfl:  .  warfarin  (COUMADIN) 5 MG tablet, Take 5 mg by mouth daily. ONLY  TAKES ON T-T-S-S NO OTHER DAYS, Disp: , Rfl:  No current facility-administered medications for this visit.  Facility-Administered Medications Ordered in Other Visits:  .  Influenza vac split quadrivalent PF (FLUARIX) injection 0.5 mL, 0.5 mL, Intramuscular, Once, Volanda Napoleon, MD  Allergies: No Known Allergies  Past Medical History, Surgical history, Social history, and Family History were reviewed and updated.  Review of Systems: As above  Physical Exam:  height is 5\' 6"  (1.676 m) and weight is 167 lb (75.751 kg). Her oral temperature is 97.7 F (36.5 C). Her blood pressure is 99/53 and her pulse is 88. Her respiration is 16.   [name]. She has no lymphadenopathy in the neck. Lungs are clear. Cardiac exam irregular rate and rhythm consistent with atrial fibrillation. She has no murmurs. Chest wall exam shows marked improvement of the the left chest wall lesion. There is no nodularity. There is no swelling. There is no exudate. There is no bloody discharge. . Abdomen is soft. She has good bowel sounds. There is no palpable liver or spleen. Back exam shows no tenderness over the spine. Extremities shows no clubbing, cyanosis or edema. She has weakness over on the right side. Skin exam no rashes. Lab Results  Component Value Date  WBC 3.9 06/08/2014   HGB 12.2 06/08/2014   HCT 37.4 06/08/2014   MCV 109* 06/08/2014   PLT 193 06/08/2014     Chemistry      Component Value Date/Time   NA 147* 06/08/2014 1045   NA 142 01/09/2014 1211   NA 141 01/12/2012 1332   K 4.1 06/08/2014 1045   K 3.9 01/09/2014 1211   K 4.5 01/12/2012 1332   CL 104 06/08/2014 1045   CL 105 01/09/2014 1211   CL 106 01/12/2012 1332   CO2 30 06/08/2014 1045   CO2 27 01/09/2014 1211   CO2 26 01/12/2012 1332   BUN 16 06/08/2014 1045   BUN 18 01/09/2014 1211   BUN 19.0 01/12/2012 1332   CREATININE 1.0 06/08/2014 1045   CREATININE  1.48* 01/09/2014 1211   CREATININE 1.1 01/12/2012 1332      Component Value Date/Time   CALCIUM 9.3 06/08/2014 1045   CALCIUM 8.9 01/09/2014 1211   CALCIUM 9.6 01/12/2012 1332   ALKPHOS 55 06/08/2014 1045   ALKPHOS 57 01/09/2014 1211   ALKPHOS 69 01/12/2012 1332   AST 23 06/08/2014 1045   AST 16 01/09/2014 1211   AST 25 01/12/2012 1332   ALT 12 06/08/2014 1045   ALT 10 01/09/2014 1211   ALT 18 01/12/2012 1332   BILITOT 0.70 06/08/2014 1045   BILITOT 0.6 01/09/2014 1211   BILITOT 0.60 01/12/2012 1332         Impression and Plan: Erin Beasley is a 81 year old female. She has metastatic breast cancer. She is responding nicely. The PET scan she had in December confirms this. We've will repeat another PET scan in March.   She's doing well with the Ibrance.  Her CA 27.29 is holding steady at 39.  We will plan to see her back in 4 weeks. I will get a PET scan before we see her back.    Volanda Napoleon, MD 2/22/201612:04 PM

## 2014-07-02 ENCOUNTER — Encounter (HOSPITAL_COMMUNITY)
Admission: RE | Admit: 2014-07-02 | Discharge: 2014-07-02 | Disposition: A | Discharge status: Home / Self Care | Payer: Medicare Other | Source: Ambulatory Visit | Attending: Hematology & Oncology | Admitting: Hematology & Oncology

## 2014-07-02 DIAGNOSIS — C50912 Malignant neoplasm of unspecified site of left female breast: Secondary | ICD-10-CM | POA: Diagnosis not present

## 2014-07-02 LAB — GLUCOSE, CAPILLARY: Glucose-Capillary: 85 mg/dL (ref 70–99)

## 2014-07-02 MED ORDER — FLUDEOXYGLUCOSE F - 18 (FDG) INJECTION: 8.5000 | Freq: Once | INTRAVENOUS | Status: AC | PRN

## 2014-07-06 ENCOUNTER — Telehealth: Payer: Self-pay | Admitting: *Deleted

## 2014-07-06 NOTE — Telephone Encounter (Addendum)
Daughter aware.  ----- Message from Volanda Napoleon, MD sent at 07/02/2014  2:54 PM EDT ----- Call her dgtr - PET scan show resoluation of left chest wall met and stable left lower neck activity. no growth of any cancer anywhere else. pete

## 2014-07-07 ENCOUNTER — Encounter: Payer: Self-pay | Admitting: Hematology & Oncology

## 2014-07-07 ENCOUNTER — Ambulatory Visit (HOSPITAL_BASED_OUTPATIENT_CLINIC_OR_DEPARTMENT_OTHER): Payer: Medicare Other | Admitting: Hematology & Oncology

## 2014-07-07 ENCOUNTER — Telehealth: Payer: Self-pay | Admitting: *Deleted

## 2014-07-07 ENCOUNTER — Other Ambulatory Visit (HOSPITAL_BASED_OUTPATIENT_CLINIC_OR_DEPARTMENT_OTHER): Payer: Medicare Other | Admitting: Lab

## 2014-07-07 ENCOUNTER — Ambulatory Visit (HOSPITAL_BASED_OUTPATIENT_CLINIC_OR_DEPARTMENT_OTHER): Payer: Medicare Other

## 2014-07-07 ENCOUNTER — Ambulatory Visit (HOSPITAL_BASED_OUTPATIENT_CLINIC_OR_DEPARTMENT_OTHER)
Admission: RE | Admit: 2014-07-07 | Discharge: 2014-07-07 | Disposition: A | Discharge status: Home / Self Care | Payer: Medicare Other | Source: Ambulatory Visit | Attending: Hematology & Oncology | Admitting: Hematology & Oncology

## 2014-07-07 VITALS — BP 119/65 | HR 96 | Temp 97.6°F | Resp 14 | Ht 66.0 in | Wt 167.0 lb

## 2014-07-07 DIAGNOSIS — Z5111 Encounter for antineoplastic chemotherapy: Secondary | ICD-10-CM | POA: Diagnosis not present

## 2014-07-07 DIAGNOSIS — C50912 Malignant neoplasm of unspecified site of left female breast: Secondary | ICD-10-CM | POA: Diagnosis not present

## 2014-07-07 DIAGNOSIS — I4891 Unspecified atrial fibrillation: Secondary | ICD-10-CM

## 2014-07-07 DIAGNOSIS — M2011 Hallux valgus (acquired), right foot: Secondary | ICD-10-CM | POA: Insufficient documentation

## 2014-07-07 DIAGNOSIS — M21611 Bunion of right foot: Secondary | ICD-10-CM

## 2014-07-07 LAB — CBC WITH DIFFERENTIAL (CANCER CENTER ONLY)
BASO#: 0 10*3/uL (ref 0.0–0.2)
BASO%: 1.1 % (ref 0.0–2.0)
EOS%: 1.1 % (ref 0.0–7.0)
Eosinophils Absolute: 0 10*3/uL (ref 0.0–0.5)
HEMATOCRIT: 36 % (ref 34.8–46.6)
HGB: 12.5 g/dL (ref 11.6–15.9)
LYMPH#: 1 10*3/uL (ref 0.9–3.3)
LYMPH%: 38.1 % (ref 14.0–48.0)
MCH: 36.1 pg — ABNORMAL HIGH (ref 26.0–34.0)
MCHC: 34.7 g/dL (ref 32.0–36.0)
MCV: 104 fL — ABNORMAL HIGH (ref 81–101)
MONO#: 0.4 10*3/uL (ref 0.1–0.9)
MONO%: 13 % (ref 0.0–13.0)
NEUT#: 1.3 10*3/uL — ABNORMAL LOW (ref 1.5–6.5)
NEUT%: 46.7 % (ref 39.6–80.0)
Platelets: 140 10*3/uL — ABNORMAL LOW (ref 145–400)
RBC: 3.46 10*6/uL — ABNORMAL LOW (ref 3.70–5.32)
RDW: 14.3 % (ref 11.1–15.7)
WBC: 2.7 10*3/uL — ABNORMAL LOW (ref 3.9–10.0)

## 2014-07-07 LAB — PROTIME-INR (CHCC SATELLITE)
INR: 2.1 (ref 2.0–3.5)
Protime: 25.2 Seconds — ABNORMAL HIGH (ref 10.6–13.4)

## 2014-07-07 LAB — CMP (CANCER CENTER ONLY)
ALT: 15 U/L (ref 10–47)
AST: 23 U/L (ref 11–38)
Albumin: 3.6 g/dL (ref 3.3–5.5)
Alkaline Phosphatase: 52 U/L (ref 26–84)
BILIRUBIN TOTAL: 0.7 mg/dL (ref 0.20–1.60)
BUN, Bld: 14 mg/dL (ref 7–22)
CO2: 29 meq/L (ref 18–33)
Calcium: 9.4 mg/dL (ref 8.0–10.3)
Chloride: 103 mEq/L (ref 98–108)
Creat: 1.1 mg/dl (ref 0.6–1.2)
Glucose, Bld: 84 mg/dL (ref 73–118)
Potassium: 4.1 mEq/L (ref 3.3–4.7)
SODIUM: 144 meq/L (ref 128–145)
Total Protein: 7.6 g/dL (ref 6.4–8.1)

## 2014-07-07 LAB — CANCER ANTIGEN 27.29: CA 27.29: 48 U/mL — AB (ref 0–39)

## 2014-07-07 MED ORDER — FULVESTRANT 250 MG/5ML IM SOLN
500.0000 mg | Freq: Once | INTRAMUSCULAR | Status: AC
  Administered 2014-07-07: 500 mg via INTRAMUSCULAR

## 2014-07-07 MED ORDER — FULVESTRANT 250 MG/5ML IM SOLN
INTRAMUSCULAR | Status: AC
  Filled 2014-07-07: qty 10

## 2014-07-07 NOTE — Telephone Encounter (Addendum)
Information relayed to daughter.   ----- Message from Volanda Napoleon, MD sent at 07/07/2014 12:48 PM EDT ----- Call her dgtr:  Foot xray shows the bunion.  No obvious bone destruction.  She probably will need to see a podiatrist to have the bunion removed!!  pete

## 2014-07-07 NOTE — Patient Instructions (Signed)
Fulvestrant injection  What is this medicine?  FULVESTRANT (ful VES trant) blocks the effects of estrogen. It is used to treat breast cancer in women past the age of menopause.  This medicine may be used for other purposes; ask your health care provider or pharmacist if you have questions.  COMMON BRAND NAME(S): FASLODEX  What should I tell my health care provider before I take this medicine?  They need to know if you have any of these conditions:  -bleeding problems  -liver disease  -low levels of platelets in the blood  -an unusual or allergic reaction to fulvestrant, other medicines, foods, dyes, or preservatives  -pregnant or trying to get pregnant  -breast-feeding  How should I use this medicine?  This medicine is for injection into a muscle. It is usually given by a health care professional in a hospital or clinic setting.  Talk to your pediatrician regarding the use of this medicine in children. Special care may be needed.  Overdosage: If you think you have taken too much of this medicine contact a poison control center or emergency room at once.  NOTE: This medicine is only for you. Do not share this medicine with others.  What if I miss a dose?  It is important not to miss your dose. Call your doctor or health care professional if you are unable to keep an appointment.  What may interact with this medicine?  -medicines that treat or prevent blood clots like warfarin, enoxaparin, and dalteparin  This list may not describe all possible interactions. Give your health care provider a list of all the medicines, herbs, non-prescription drugs, or dietary supplements you use. Also tell them if you smoke, drink alcohol, or use illegal drugs. Some items may interact with your medicine.  What should I watch for while using this medicine?  Your condition will be monitored carefully while you are receiving this medicine. You will need important blood work done while you are taking this medicine.  Do not become pregnant  while taking this medicine. Women should inform their doctor if they wish to become pregnant or think they might be pregnant. There is a potential for serious side effects to an unborn child. Talk to your health care professional or pharmacist for more information.  What side effects may I notice from receiving this medicine?  Side effects that you should report to your doctor or health care professional as soon as possible:  -allergic reactions like skin rash, itching or hives, swelling of the face, lips, or tongue  -feeling faint or lightheaded, falls  -fever or flu-like symptoms  -sore throat  -vaginal bleeding  Side effects that usually do not require medical attention (report to your doctor or health care professional if they continue or are bothersome):  -aches, pains  -constipation or diarrhea  -headache  -hot flashes  -nausea, vomiting  -pain at site where injected  -stomach pain  This list may not describe all possible side effects. Call your doctor for medical advice about side effects. You may report side effects to FDA at 1-800-FDA-1088.  Where should I keep my medicine?  This drug is given in a hospital or clinic and will not be stored at home.  NOTE: This sheet is a summary. It may not cover all possible information. If you have questions about this medicine, talk to your doctor, pharmacist, or health care provider.   2015, Elsevier/Gold Standard. (2007-08-12 15:39:24)

## 2014-07-07 NOTE — Progress Notes (Signed)
Hematology and Oncology Follow Up Visit  Erin Beasley [id number] 28-Sep-1928 81 y.o. 07/07/2014   Principle Diagnosis:   Metastatic breast cancer-ER positive  CVA with residual right-sided weakness  Chronic atrial fibrillation  Current Therapy:   Faslodex 500 mg IM every month Ibrance 100 mg by mouth  (21/7)     Interim History:  [name] is back for followup. She is doing pretty well. She is eating well. She's not having any issues with respect to bleeding. She is on Coumadin.  She had a PET scan done recently. This showed resolution of the left chest wall mass. This was after radiation. It showed basically stable disease otherwise.  Her last CA 27.29 was 45 which is holding pretty steady.  She is looking for to Easter. She and her husband will be at one of their children's house.  She's had no cough. His been no nausea or vomiting. She's had no change in bowel or bladder habits.  Overall, her performance status is ECOG 2.    Medications:  Current outpatient prescriptions:  .  calcium carbonate (TUMS - DOSED IN MG ELEMENTAL CALCIUM) 500 MG chewable tablet, Chew 1 tablet by mouth daily., Disp: , Rfl:  .  Cholecalciferol (VITAMIN D PO), Take by mouth every morning. , Disp: , Rfl:  .  diltiazem (CARDIZEM CD) 120 MG 24 hr capsule, TAKE ONE CAPSULE BY MOUTH EVERY DAY, Disp: 30 capsule, Rfl: 3 .  famotidine (PEPCID) 20 MG tablet, Take 20 mg by mouth daily. , Disp: , Rfl:  .  Fulvestrant (FASLODEX IM), Inject into the muscle every 30 (thirty) days., Disp: , Rfl:  .  memantine (NAMENDA) 5 MG tablet, Take 5 mg by mouth 2 (two) times daily., Disp: , Rfl: 6 .  metoprolol (LOPRESSOR) 50 MG tablet, Take 50 mg by mouth 2 (two) times daily. , Disp: , Rfl:  .  palbociclib (IBRANCE) 100 MG capsule, Take 100 mg by mouth as directed. Take whole with food. ON 21 DAYS AND OFF 7 DAYS AND REPEAT., Disp: , Rfl:  .  simvastatin (ZOCOR) 20 MG tablet, Take 20 mg by mouth at bedtime. , Disp: ,  Rfl:  .  warfarin (COUMADIN) 5 MG tablet, Take 5 mg by mouth daily. ONLY  TAKES ON T-T-S-S NO OTHER DAYS, Disp: , Rfl:  No current facility-administered medications for this visit.  Facility-Administered Medications Ordered in Other Visits:  .  fulvestrant (FASLODEX) injection 500 mg, 500 mg, Intramuscular, Once, Volanda Napoleon, MD .  Influenza vac split quadrivalent PF (FLUARIX) injection 0.5 mL, 0.5 mL, Intramuscular, Once, Volanda Napoleon, MD  Allergies: No Known Allergies  Past Medical History, Surgical history, Social history, and Family History were reviewed and updated.  Review of Systems: As above  Physical Exam:  height is 5\' 6"  (1.676 m) and weight is 167 lb (75.751 kg). Her oral temperature is 97.6 F (36.4 C). Her blood pressure is 119/65 and her pulse is 96. Her respiration is 14.   [name]. She has no lymphadenopathy in the neck. Lungs are clear. Cardiac exam irregular rate and rhythm consistent with atrial fibrillation. She has no murmurs. Chest wall exam shows marked improvement of the the left chest wall lesion. There is no nodularity. There is no swelling. There is no exudate. There is no bloody discharge. . Abdomen is soft. She has good bowel sounds. There is no palpable liver or spleen. Back exam shows no tenderness over the spine. Extremities shows no  clubbing, cyanosis or edema. On the medial aspect of the right foot at the first MT joint, there is a large bunion. This is slightly tender but not erythematous. She has weakness over on the right side. Skin exam no rashes, ecchymoses or petechia. Neurological exam shows the chronic right-sided weakness. Lab Results  Component Value Date   WBC 2.7* 07/07/2014   HGB 12.5 07/07/2014   HCT 36.0 07/07/2014   MCV 104* 07/07/2014   PLT 140* 07/07/2014     Chemistry      Component Value Date/Time   NA 147* 06/08/2014 1045   NA 142 01/09/2014 1211   NA 141 01/12/2012 1332   K 4.1 06/08/2014 1045    K 3.9 01/09/2014 1211   K 4.5 01/12/2012 1332   CL 104 06/08/2014 1045   CL 105 01/09/2014 1211   CL 106 01/12/2012 1332   CO2 30 06/08/2014 1045   CO2 27 01/09/2014 1211   CO2 26 01/12/2012 1332   BUN 16 06/08/2014 1045   BUN 18 01/09/2014 1211   BUN 19.0 01/12/2012 1332   CREATININE 1.0 06/08/2014 1045   CREATININE 1.48* 01/09/2014 1211   CREATININE 1.1 01/12/2012 1332      Component Value Date/Time   CALCIUM 9.3 06/08/2014 1045   CALCIUM 8.9 01/09/2014 1211   CALCIUM 9.6 01/12/2012 1332   ALKPHOS 55 06/08/2014 1045   ALKPHOS 57 01/09/2014 1211   ALKPHOS 69 01/12/2012 1332   AST 23 06/08/2014 1045   AST 16 01/09/2014 1211   AST 25 01/12/2012 1332   ALT 12 06/08/2014 1045   ALT 10 01/09/2014 1211   ALT 18 01/12/2012 1332   BILITOT 0.70 06/08/2014 1045   BILITOT 0.6 01/09/2014 1211   BILITOT 0.60 01/12/2012 1332         Impression and Plan: Erin Beasley is a 81 year old female. She has metastatic breast cancer. She is responding nicely. The PET scan she had in December confirms this. We've will repeat another PET scan in March.   She's doing well with the Ibrance.  I will go ahead and get an x-ray of her right foot. Her husband showing that she had a large bunion area did want to make sure that there is no Bone destruction. She may need to see a with [facility] store podiatrist for this.  We will plan to see her back in 5 weeks. We will not need another PET scan probably for another 3 months. We will keep monitoring her CA 27.29 level  Volanda Napoleon, MD 3/22/201610:05 AM

## 2014-07-09 ENCOUNTER — Other Ambulatory Visit: Payer: Self-pay | Admitting: Family

## 2014-08-11 ENCOUNTER — Ambulatory Visit (HOSPITAL_BASED_OUTPATIENT_CLINIC_OR_DEPARTMENT_OTHER): Payer: Medicare Other

## 2014-08-11 ENCOUNTER — Ambulatory Visit (HOSPITAL_BASED_OUTPATIENT_CLINIC_OR_DEPARTMENT_OTHER): Payer: Medicare Other | Admitting: Hematology & Oncology

## 2014-08-11 ENCOUNTER — Other Ambulatory Visit (HOSPITAL_BASED_OUTPATIENT_CLINIC_OR_DEPARTMENT_OTHER): Payer: Medicare Other

## 2014-08-11 ENCOUNTER — Encounter: Payer: Self-pay | Admitting: Hematology & Oncology

## 2014-08-11 VITALS — BP 100/61 | HR 96 | Temp 98.1°F | Resp 16 | Ht 66.0 in | Wt 167.0 lb

## 2014-08-11 DIAGNOSIS — C50911 Malignant neoplasm of unspecified site of right female breast: Secondary | ICD-10-CM | POA: Diagnosis not present

## 2014-08-11 DIAGNOSIS — Z170 Estrogen receptor positive status [ER+]: Secondary | ICD-10-CM

## 2014-08-11 DIAGNOSIS — Z5111 Encounter for antineoplastic chemotherapy: Secondary | ICD-10-CM

## 2014-08-11 DIAGNOSIS — C50912 Malignant neoplasm of unspecified site of left female breast: Secondary | ICD-10-CM

## 2014-08-11 LAB — CBC WITH DIFFERENTIAL (CANCER CENTER ONLY)
BASO#: 0.1 10*3/uL (ref 0.0–0.2)
BASO%: 1.4 % (ref 0.0–2.0)
EOS%: 0.6 % (ref 0.0–7.0)
Eosinophils Absolute: 0 10*3/uL (ref 0.0–0.5)
HEMATOCRIT: 35.5 % (ref 34.8–46.6)
HGB: 12.1 g/dL (ref 11.6–15.9)
LYMPH#: 0.9 10*3/uL (ref 0.9–3.3)
LYMPH%: 25.9 % (ref 14.0–48.0)
MCH: 35.7 pg — ABNORMAL HIGH (ref 26.0–34.0)
MCHC: 34.1 g/dL (ref 32.0–36.0)
MCV: 105 fL — ABNORMAL HIGH (ref 81–101)
MONO#: 0.2 10*3/uL (ref 0.1–0.9)
MONO%: 5.2 % (ref 0.0–13.0)
NEUT#: 2.3 10*3/uL (ref 1.5–6.5)
NEUT%: 66.9 % (ref 39.6–80.0)
PLATELETS: 247 10*3/uL (ref 145–400)
RBC: 3.39 10*6/uL — AB (ref 3.70–5.32)
RDW: 15 % (ref 11.1–15.7)
WBC: 3.5 10*3/uL — ABNORMAL LOW (ref 3.9–10.0)

## 2014-08-11 LAB — COMPREHENSIVE METABOLIC PANEL
ALT: 8 U/L (ref 0–35)
AST: 16 U/L (ref 0–37)
Albumin: 3.8 g/dL (ref 3.5–5.2)
Alkaline Phosphatase: 48 U/L (ref 39–117)
BILIRUBIN TOTAL: 0.7 mg/dL (ref 0.2–1.2)
BUN: 21 mg/dL (ref 6–23)
CALCIUM: 9.2 mg/dL (ref 8.4–10.5)
CO2: 27 mEq/L (ref 19–32)
Chloride: 106 mEq/L (ref 96–112)
Creatinine, Ser: 1.19 mg/dL — ABNORMAL HIGH (ref 0.50–1.10)
GLUCOSE: 90 mg/dL (ref 70–99)
Potassium: 4.3 mEq/L (ref 3.5–5.3)
Sodium: 141 mEq/L (ref 135–145)
Total Protein: 6.7 g/dL (ref 6.0–8.3)

## 2014-08-11 LAB — CANCER ANTIGEN 27.29: CA 27.29: 47 U/mL — AB (ref 0–39)

## 2014-08-11 MED ORDER — FULVESTRANT 250 MG/5ML IM SOLN
INTRAMUSCULAR | Status: AC
  Filled 2014-08-11: qty 10

## 2014-08-11 MED ORDER — FULVESTRANT 250 MG/5ML IM SOLN
500.0000 mg | Freq: Once | INTRAMUSCULAR | Status: AC
  Administered 2014-08-11: 500 mg via INTRAMUSCULAR

## 2014-08-11 NOTE — Patient Instructions (Signed)
Fulvestrant injection  What is this medicine?  FULVESTRANT (ful VES trant) blocks the effects of estrogen. It is used to treat breast cancer in women past the age of menopause.  This medicine may be used for other purposes; ask your health care provider or pharmacist if you have questions.  COMMON BRAND NAME(S): FASLODEX  What should I tell my health care provider before I take this medicine?  They need to know if you have any of these conditions:  -bleeding problems  -liver disease  -low levels of platelets in the blood  -an unusual or allergic reaction to fulvestrant, other medicines, foods, dyes, or preservatives  -pregnant or trying to get pregnant  -breast-feeding  How should I use this medicine?  This medicine is for injection into a muscle. It is usually given by a health care professional in a hospital or clinic setting.  Talk to your pediatrician regarding the use of this medicine in children. Special care may be needed.  Overdosage: If you think you have taken too much of this medicine contact a poison control center or emergency room at once.  NOTE: This medicine is only for you. Do not share this medicine with others.  What if I miss a dose?  It is important not to miss your dose. Call your doctor or health care professional if you are unable to keep an appointment.  What may interact with this medicine?  -medicines that treat or prevent blood clots like warfarin, enoxaparin, and dalteparin  This list may not describe all possible interactions. Give your health care provider a list of all the medicines, herbs, non-prescription drugs, or dietary supplements you use. Also tell them if you smoke, drink alcohol, or use illegal drugs. Some items may interact with your medicine.  What should I watch for while using this medicine?  Your condition will be monitored carefully while you are receiving this medicine. You will need important blood work done while you are taking this medicine.  Do not become pregnant  while taking this medicine. Women should inform their doctor if they wish to become pregnant or think they might be pregnant. There is a potential for serious side effects to an unborn child. Talk to your health care professional or pharmacist for more information.  What side effects may I notice from receiving this medicine?  Side effects that you should report to your doctor or health care professional as soon as possible:  -allergic reactions like skin rash, itching or hives, swelling of the face, lips, or tongue  -feeling faint or lightheaded, falls  -fever or flu-like symptoms  -sore throat  -vaginal bleeding  Side effects that usually do not require medical attention (report to your doctor or health care professional if they continue or are bothersome):  -aches, pains  -constipation or diarrhea  -headache  -hot flashes  -nausea, vomiting  -pain at site where injected  -stomach pain  This list may not describe all possible side effects. Call your doctor for medical advice about side effects. You may report side effects to FDA at 1-800-FDA-1088.  Where should I keep my medicine?  This drug is given in a hospital or clinic and will not be stored at home.  NOTE: This sheet is a summary. It may not cover all possible information. If you have questions about this medicine, talk to your doctor, pharmacist, or health care provider.   2015, Elsevier/Gold Standard. (2007-08-12 15:39:24)

## 2014-08-11 NOTE — Progress Notes (Signed)
Hematology and Oncology Follow Up Visit  Erin Beasley [id number] [birth year] 81 y.o. 08/11/2014   Principle Diagnosis:   Metastatic breast cancer-ER positive  CVA with residual right-sided weakness  Chronic atrial fibrillation  Current Therapy:   Faslodex 500 mg IM every month Ibrance 100 mg by mouth  (21/7)     Interim History:  [name] is back for followup. She is doing pretty well. She is eating well. She's not having any issues with respect to bleeding. She is on Coumadin.  Her last CA 27.29 was 48 which is slowly trending upward.  She's had no fever. She's had no rashes. She's had no headache.  She is on [place]. She seems to be tolerating it pretty well. She is about about a week into already her next cycle of treatment.  There's been no leg swelling.  There's been no chest wall pain.   Overall, her performance status is ECOG 2.    Medications:  Current outpatient prescriptions:  .  calcium carbonate (TUMS - DOSED IN MG ELEMENTAL CALCIUM) 500 MG chewable tablet, Chew 1 tablet by mouth daily., Disp: , Rfl:  .  Cholecalciferol (VITAMIN D PO), Take by mouth every morning. , Disp: , Rfl:  .  diltiazem (CARDIZEM CD) 120 MG 24 hr capsule, TAKE ONE CAPSULE BY MOUTH EVERY DAY, Disp: 30 capsule, Rfl: 3 .  famotidine (PEPCID) 20 MG tablet, Take 20 mg by mouth daily. , Disp: , Rfl:  .  Fulvestrant (FASLODEX IM), Inject into the muscle every 30 (thirty) days., Disp: , Rfl:  .  memantine (NAMENDA) 5 MG tablet, Take 5 mg by mouth 2 (two) times daily., Disp: , Rfl: 6 .  metoprolol (LOPRESSOR) 50 MG tablet, Take 50 mg by mouth 2 (two) times daily. , Disp: , Rfl:  .  palbociclib (IBRANCE) 100 MG capsule, Take 100 mg by mouth as directed. Take whole with food. ON 21 DAYS AND OFF 7 DAYS AND REPEAT., Disp: , Rfl:  .  simvastatin (ZOCOR) 20 MG tablet, Take 20 mg by mouth at bedtime. , Disp: , Rfl:  .  warfarin (COUMADIN) 5 MG tablet, Take 5 mg by mouth daily. ONLY  TAKES ON  T-T-S-S NO OTHER DAYS, Disp: , Rfl:  No current facility-administered medications for this visit.  Facility-Administered Medications Ordered in Other Visits:  .  Influenza vac split quadrivalent PF (FLUARIX) injection 0.5 mL, 0.5 mL, Intramuscular, Once, Erin Napoleon, MD  Allergies: No Known Allergies  Past Medical History, Surgical history, Social history, and Family History were reviewed and updated.  Review of Systems: As above  Physical Exam:  height is 5\' 6"  (1.676 m) and weight is 167 lb (75.751 kg). Her oral temperature is 98.1 F (36.7 C). Her blood pressure is 100/61 and her pulse is 96. Her respiration is 16.   [name]. She has a palpable posterior left subclavicular lymph node. It is nontender. The lymph node is about 1 x 1.5cm. Lungs are clear. Cardiac exam irregular rate and rhythm consistent with atrial fibrillation. She has no murmurs. Chest wall exam shows marked improvement of the the left chest wall lesion. There is no nodularity. There is no swelling. There is no exudate. There is no bloody discharge. . Abdomen is soft. She has good bowel sounds. There is no palpable liver or spleen. Back exam shows no tenderness over the spine. Extremities shows no clubbing, cyanosis or edema. On the medial aspect of the right foot at the first  MT joint, there is a large bunion. This is slightly tender but not erythematous. She has weakness over on the right side. Skin exam no rashes, ecchymoses or petechia. Neurological exam shows the chronic right-sided weakness. Lab Results  Component Value Date   WBC 3.5* 08/11/2014   HGB 12.1 08/11/2014   HCT 35.5 08/11/2014   MCV 105* 08/11/2014   PLT 247 08/11/2014     Chemistry      Component Value Date/Time   NA 144 07/07/2014 0930   NA 142 01/09/2014 1211   NA 141 01/12/2012 1332   K 4.1 07/07/2014 0930   K 3.9 01/09/2014 1211   K 4.5 01/12/2012 1332   CL 103 07/07/2014 0930   CL 105 01/09/2014 1211   CL  106 01/12/2012 1332   CO2 29 07/07/2014 0930   CO2 27 01/09/2014 1211   CO2 26 01/12/2012 1332   BUN 14 07/07/2014 0930   BUN 18 01/09/2014 1211   BUN 19.0 01/12/2012 1332   CREATININE 1.1 07/07/2014 0930   CREATININE 1.48* 01/09/2014 1211   CREATININE 1.1 01/12/2012 1332      Component Value Date/Time   CALCIUM 9.4 07/07/2014 0930   CALCIUM 8.9 01/09/2014 1211   CALCIUM 9.6 01/12/2012 1332   ALKPHOS 52 07/07/2014 0930   ALKPHOS 57 01/09/2014 1211   ALKPHOS 69 01/12/2012 1332   AST 23 07/07/2014 0930   AST 16 01/09/2014 1211   AST 25 01/12/2012 1332   ALT 15 07/07/2014 0930   ALT 10 01/09/2014 1211   ALT 18 01/12/2012 1332   BILITOT 0.70 07/07/2014 0930   BILITOT 0.6 01/09/2014 1211   BILITOT 0.60 01/12/2012 1332         Impression and Plan: Erin Beasley is a 81 year old female. She has metastatic breast cancer. She is responding nicely.   I am somewhat concerned about this left supra-clavicular lymph node. I really have not felt this before area and we will have to watch this closely.  I think that her CA 27.29 will be a good marker for [place].  I show this lymph node to her daughter. She will be aware of it if there is any issues. I would not think that this lymph node will cause any problems.  We will not have to do another scan on her until June.  She will go ahead with her Faslodex today. Erin Napoleon, MD 4/26/201612:24 PM

## 2014-08-12 ENCOUNTER — Telehealth: Payer: Self-pay | Admitting: Hematology & Oncology

## 2014-08-12 NOTE — Telephone Encounter (Signed)
Left pt message with 5-26 appointment

## 2014-09-10 ENCOUNTER — Ambulatory Visit (HOSPITAL_BASED_OUTPATIENT_CLINIC_OR_DEPARTMENT_OTHER): Payer: Medicare Other | Admitting: Hematology & Oncology

## 2014-09-10 ENCOUNTER — Encounter: Payer: Self-pay | Admitting: Hematology & Oncology

## 2014-09-10 ENCOUNTER — Other Ambulatory Visit (HOSPITAL_BASED_OUTPATIENT_CLINIC_OR_DEPARTMENT_OTHER): Payer: Medicare Other

## 2014-09-10 ENCOUNTER — Emergency Department (HOSPITAL_COMMUNITY): Payer: Medicare Other

## 2014-09-10 ENCOUNTER — Ambulatory Visit (HOSPITAL_BASED_OUTPATIENT_CLINIC_OR_DEPARTMENT_OTHER): Payer: Medicare Other

## 2014-09-10 ENCOUNTER — Telehealth: Payer: Self-pay | Admitting: Hematology & Oncology

## 2014-09-10 ENCOUNTER — Encounter (HOSPITAL_COMMUNITY): Payer: Self-pay | Admitting: *Deleted

## 2014-09-10 ENCOUNTER — Other Ambulatory Visit: Payer: Self-pay

## 2014-09-10 ENCOUNTER — Emergency Department (HOSPITAL_COMMUNITY)
Admission: EM | Admit: 2014-09-10 | Discharge: 2014-09-10 | Disposition: A | Discharge status: Home / Self Care | Payer: Medicare Other | Attending: Emergency Medicine | Admitting: Emergency Medicine

## 2014-09-10 VITALS — BP 86/51 | HR 73 | Temp 98.1°F | Resp 16 | Wt 163.0 lb

## 2014-09-10 DIAGNOSIS — I482 Chronic atrial fibrillation: Secondary | ICD-10-CM

## 2014-09-10 DIAGNOSIS — C50912 Malignant neoplasm of unspecified site of left female breast: Secondary | ICD-10-CM

## 2014-09-10 DIAGNOSIS — Y939 Activity, unspecified: Secondary | ICD-10-CM | POA: Diagnosis not present

## 2014-09-10 DIAGNOSIS — Z79899 Other long term (current) drug therapy: Secondary | ICD-10-CM | POA: Insufficient documentation

## 2014-09-10 DIAGNOSIS — Z5111 Encounter for antineoplastic chemotherapy: Secondary | ICD-10-CM | POA: Diagnosis not present

## 2014-09-10 DIAGNOSIS — W1839XA Other fall on same level, initial encounter: Secondary | ICD-10-CM | POA: Insufficient documentation

## 2014-09-10 DIAGNOSIS — Z8719 Personal history of other diseases of the digestive system: Secondary | ICD-10-CM | POA: Diagnosis not present

## 2014-09-10 DIAGNOSIS — Z170 Estrogen receptor positive status [ER+]: Secondary | ICD-10-CM | POA: Diagnosis not present

## 2014-09-10 DIAGNOSIS — C50911 Malignant neoplasm of unspecified site of right female breast: Secondary | ICD-10-CM

## 2014-09-10 DIAGNOSIS — Y929 Unspecified place or not applicable: Secondary | ICD-10-CM | POA: Diagnosis not present

## 2014-09-10 DIAGNOSIS — Z853 Personal history of malignant neoplasm of breast: Secondary | ICD-10-CM | POA: Insufficient documentation

## 2014-09-10 DIAGNOSIS — Y999 Unspecified external cause status: Secondary | ICD-10-CM | POA: Diagnosis not present

## 2014-09-10 DIAGNOSIS — Z8673 Personal history of transient ischemic attack (TIA), and cerebral infarction without residual deficits: Secondary | ICD-10-CM | POA: Diagnosis not present

## 2014-09-10 DIAGNOSIS — W19XXXA Unspecified fall, initial encounter: Secondary | ICD-10-CM

## 2014-09-10 DIAGNOSIS — Z7901 Long term (current) use of anticoagulants: Secondary | ICD-10-CM

## 2014-09-10 DIAGNOSIS — Z041 Encounter for examination and observation following transport accident: Secondary | ICD-10-CM | POA: Diagnosis not present

## 2014-09-10 LAB — CMP (CANCER CENTER ONLY)
ALBUMIN: 3.7 g/dL (ref 3.3–5.5)
ALT(SGPT): 15 U/L (ref 10–47)
AST: 26 U/L (ref 11–38)
Alkaline Phosphatase: 46 U/L (ref 26–84)
BILIRUBIN TOTAL: 1 mg/dL (ref 0.20–1.60)
BUN, Bld: 15 mg/dL (ref 7–22)
CALCIUM: 9.5 mg/dL (ref 8.0–10.3)
CO2: 28 mEq/L (ref 18–33)
Chloride: 102 mEq/L (ref 98–108)
Creat: 1.7 mg/dl — ABNORMAL HIGH (ref 0.6–1.2)
Glucose, Bld: 82 mg/dL (ref 73–118)
Potassium: 4.8 mEq/L — ABNORMAL HIGH (ref 3.3–4.7)
Sodium: 141 mEq/L (ref 128–145)
Total Protein: 6.8 g/dL (ref 6.4–8.1)

## 2014-09-10 LAB — CBC WITH DIFFERENTIAL (CANCER CENTER ONLY)
BASO#: 0.1 10*3/uL (ref 0.0–0.2)
BASO%: 2.3 % — AB (ref 0.0–2.0)
EOS%: 2.3 % (ref 0.0–7.0)
Eosinophils Absolute: 0.1 10*3/uL (ref 0.0–0.5)
HCT: 36.6 % (ref 34.8–46.6)
HEMOGLOBIN: 12.2 g/dL (ref 11.6–15.9)
LYMPH#: 1.1 10*3/uL (ref 0.9–3.3)
LYMPH%: 32.4 % (ref 14.0–48.0)
MCH: 35.4 pg — ABNORMAL HIGH (ref 26.0–34.0)
MCHC: 33.3 g/dL (ref 32.0–36.0)
MCV: 106 fL — AB (ref 81–101)
MONO#: 0.2 10*3/uL (ref 0.1–0.9)
MONO%: 5.7 % (ref 0.0–13.0)
NEUT%: 57.3 % (ref 39.6–80.0)
NEUTROS ABS: 2 10*3/uL (ref 1.5–6.5)
Platelets: 219 10*3/uL (ref 145–400)
RBC: 3.45 10*6/uL — ABNORMAL LOW (ref 3.70–5.32)
RDW: 14.4 % (ref 11.1–15.7)
WBC: 3.5 10*3/uL — ABNORMAL LOW (ref 3.9–10.0)

## 2014-09-10 LAB — BASIC METABOLIC PANEL
Anion gap: 12 (ref 5–15)
BUN: 15 mg/dL (ref 6–20)
CO2: 23 mmol/L (ref 22–32)
Calcium: 9.1 mg/dL (ref 8.9–10.3)
Chloride: 105 mmol/L (ref 101–111)
Creatinine, Ser: 1.35 mg/dL — ABNORMAL HIGH (ref 0.44–1.00)
GFR calc Af Amer: 41 mL/min — ABNORMAL LOW (ref >60)
GFR calc non Af Amer: 35 mL/min — ABNORMAL LOW (ref >60)
Glucose, Bld: 106 mg/dL — ABNORMAL HIGH (ref 65–99)
Potassium: 4.5 mmol/L (ref 3.5–5.1)
Sodium: 140 mmol/L (ref 135–145)

## 2014-09-10 LAB — CBC WITH DIFFERENTIAL/PLATELET
BASOS ABS: 0 10*3/uL (ref 0.0–0.1)
BASOS PCT: 1 % (ref 0–1)
EOS ABS: 0 10*3/uL (ref 0.0–0.7)
EOS PCT: 0 % (ref 0–5)
HCT: 35.2 % — ABNORMAL LOW (ref 36.0–46.0)
Hemoglobin: 12 g/dL (ref 12.0–15.0)
LYMPHS ABS: 0.5 10*3/uL — AB (ref 0.7–4.0)
Lymphocytes Relative: 10 % — ABNORMAL LOW (ref 12–46)
MCH: 35.1 pg — ABNORMAL HIGH (ref 26.0–34.0)
MCHC: 34.1 g/dL (ref 30.0–36.0)
MCV: 102.9 fL — ABNORMAL HIGH (ref 78.0–100.0)
MONO ABS: 0.1 10*3/uL (ref 0.1–1.0)
Monocytes Relative: 1 % — ABNORMAL LOW (ref 3–12)
Neutro Abs: 4.4 10*3/uL (ref 1.7–7.7)
Neutrophils Relative %: 88 % — ABNORMAL HIGH (ref 43–77)
Platelets: 180 10*3/uL (ref 150–400)
RBC: 3.42 MIL/uL — ABNORMAL LOW (ref 3.87–5.11)
RDW: 14.8 % (ref 11.5–15.5)
WBC: 5 10*3/uL (ref 4.0–10.5)

## 2014-09-10 LAB — CANCER ANTIGEN 27.29: CA 27.29: 47 U/mL — ABNORMAL HIGH (ref 0–39)

## 2014-09-10 MED ORDER — FULVESTRANT 250 MG/5ML IM SOLN
500.0000 mg | Freq: Once | INTRAMUSCULAR | Status: AC
  Administered 2014-09-10: 500 mg via INTRAMUSCULAR

## 2014-09-10 MED ORDER — FULVESTRANT 250 MG/5ML IM SOLN
INTRAMUSCULAR | Status: AC
  Filled 2014-09-10: qty 10

## 2014-09-10 NOTE — Patient Instructions (Signed)
Fulvestrant injection  What is this medicine?  FULVESTRANT (ful VES trant) blocks the effects of estrogen. It is used to treat breast cancer in women past the age of menopause.  This medicine may be used for other purposes; ask your health care provider or pharmacist if you have questions.  COMMON BRAND NAME(S): FASLODEX  What should I tell my health care provider before I take this medicine?  They need to know if you have any of these conditions:  -bleeding problems  -liver disease  -low levels of platelets in the blood  -an unusual or allergic reaction to fulvestrant, other medicines, foods, dyes, or preservatives  -pregnant or trying to get pregnant  -breast-feeding  How should I use this medicine?  This medicine is for injection into a muscle. It is usually given by a health care professional in a hospital or clinic setting.  Talk to your pediatrician regarding the use of this medicine in children. Special care may be needed.  Overdosage: If you think you have taken too much of this medicine contact a poison control center or emergency room at once.  NOTE: This medicine is only for you. Do not share this medicine with others.  What if I miss a dose?  It is important not to miss your dose. Call your doctor or health care professional if you are unable to keep an appointment.  What may interact with this medicine?  -medicines that treat or prevent blood clots like warfarin, enoxaparin, and dalteparin  This list may not describe all possible interactions. Give your health care provider a list of all the medicines, herbs, non-prescription drugs, or dietary supplements you use. Also tell them if you smoke, drink alcohol, or use illegal drugs. Some items may interact with your medicine.  What should I watch for while using this medicine?  Your condition will be monitored carefully while you are receiving this medicine. You will need important blood work done while you are taking this medicine.  Do not become pregnant  while taking this medicine. Women should inform their doctor if they wish to become pregnant or think they might be pregnant. There is a potential for serious side effects to an unborn child. Talk to your health care professional or pharmacist for more information.  What side effects may I notice from receiving this medicine?  Side effects that you should report to your doctor or health care professional as soon as possible:  -allergic reactions like skin rash, itching or hives, swelling of the face, lips, or tongue  -feeling faint or lightheaded, falls  -fever or flu-like symptoms  -sore throat  -vaginal bleeding  Side effects that usually do not require medical attention (report to your doctor or health care professional if they continue or are bothersome):  -aches, pains  -constipation or diarrhea  -headache  -hot flashes  -nausea, vomiting  -pain at site where injected  -stomach pain  This list may not describe all possible side effects. Call your doctor for medical advice about side effects. You may report side effects to FDA at 1-800-FDA-1088.  Where should I keep my medicine?  This drug is given in a hospital or clinic and will not be stored at home.  NOTE: This sheet is a summary. It may not cover all possible information. If you have questions about this medicine, talk to your doctor, pharmacist, or health care provider.   2015, Elsevier/Gold Standard. (2007-08-12 15:39:24)

## 2014-09-10 NOTE — Telephone Encounter (Signed)
Mailed out future appt for June Pet and dr appt.

## 2014-09-10 NOTE — ED Provider Notes (Signed)
Pt seen and evaluated.  D/W Irena Cords PA.  CT of head and neck show no acute abnormalities. She's awake alert no other additional signs of injury. She and family are confident that she lost her balance and fell and did not syncope.  Tanna Furry, MD 09/10/14 2113

## 2014-09-10 NOTE — ED Notes (Signed)
Pt in from home via [facility], per report pt was standing & fell to the floor unwitnessed, unknown LOC, pt moves all extremities, pt c/o neck pain, per EMS pt does not tolerate c collar, pt presents to ED with towel taped in an attempt to secure neck, pt placed in c collar upon arrival to ED, pt alert to person, pt at baseline per husband,pt hx of dementia, pt c/o generalized pain, pt at baseline ambulatory with assistance

## 2014-09-10 NOTE — ED Provider Notes (Signed)
CSN: [id number]     Arrival date & time 09/10/14  1707 History   First MD Initiated Contact with Patient 09/10/14 1718     Chief Complaint  Patient presents with  . Fall     (Consider location/radiation/quality/duration/timing/severity/associated sxs/prior Treatment) HPI Patient presents to the emergency department following a fall that occurred just prior to arrival.  Patient was on her front porch when she stumbled and fell onto her right side.  The patient did not lose consciousness.  The patient has not had any chest pain, shortness of breath, nausea, vomiting, weakness, dizziness, back pain, neck pain, hip pain, abdominal pain or syncope.  The patient states that she is not really hurting at this time, but just scared after falling Past Medical History  Diagnosis Date  . Stroke 1989  . Diverticulitis   . Cancer 2008, 2013    breast   . 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 Radiation 12/10/13-12/24/13    Left lateral chest wall mass 30 gray   Past Surgical History  Procedure Laterality Date  . Colon surgery    . Breast surgery      left [facility]  . Lymph node dissection Left 12/31/2003    axillary  . Mastectomy Left   . Punch biopsy of skin  02/09/2012    left chest wall   Family History  Problem Relation Age of Onset  . Ovarian cancer Sister    History  Substance Use Topics  . Smoking status: Never Smoker   . Smokeless tobacco: Never Used     Comment: never used tobacco  . Alcohol Use: No   OB History    No data available     Review of Systems All other systems negative except as documented in the HPI. All pertinent positives and negatives as reviewed in the HPI. Allergies  Review of patient's allergies indicates no known allergies.  Home Medications   Prior to Admission medications   Medication Sig Start Date End Date Taking? Authorizing Provider  calcium carbonate (TUMS - DOSED IN MG ELEMENTAL CALCIUM) 500 MG chewable tablet Chew 1 tablet by mouth  daily as needed for indigestion or heartburn.    Yes Historical Provider, MD  Cholecalciferol (VITAMIN D PO) Take 1 tablet by mouth every morning.    Yes Historical Provider, MD  diltiazem (CARDIZEM CD) 120 MG 24 hr capsule TAKE ONE CAPSULE BY MOUTH EVERY DAY 05/26/14  Yes Volanda Napoleon, MD  famotidine (PEPCID) 20 MG tablet Take 20 mg by mouth daily.  01/08/12  Yes Historical Provider, MD  Fulvestrant (FASLODEX IM) Inject into the muscle every 30 (thirty) days.   Yes Historical Provider, MD  memantine (NAMENDA) 5 MG tablet Take 5 mg by mouth 2 (two) times daily. 03/25/14  Yes Historical Provider, MD  metoprolol (LOPRESSOR) 50 MG tablet Take 50 mg by mouth 2 (two) times daily.  12/30/11  Yes Historical Provider, MD  palbociclib (IBRANCE) 100 MG capsule Take 100 mg by mouth as directed. Take whole with food. ON 21 DAYS AND OFF 7 DAYS AND REPEAT. 12/09/13  Yes Volanda Napoleon, MD  simvastatin (ZOCOR) 20 MG tablet Take 20 mg by mouth at bedtime.  01/11/12  Yes Historical Provider, MD  warfarin (COUMADIN) 5 MG tablet Take 5 mg by mouth See admin instructions. TAKES 48m on Tue / Thur / Sat / Sun ONLY Take no medication on all other days 01/28/12  Yes Historical Provider, MD   BP 103/70 mmHg  Pulse 70  Temp(Src) 97.6 F (36.4 C) (Oral)  Resp 15  SpO2 95% Physical Exam  Constitutional: She is oriented to person, place, and time. She appears well-developed and well-nourished. No distress.  HENT:  Head: Normocephalic and atraumatic.  Mouth/Throat: Oropharynx is clear and moist.  Eyes: Pupils are equal, round, and reactive to light.  Neck: Normal range of motion. Neck supple.  Cardiovascular: Normal rate, regular rhythm and normal heart sounds.  Exam reveals no gallop and no friction rub.   No murmur heard. Pulmonary/Chest: Effort normal and breath sounds normal. No respiratory distress.  Abdominal: Soft. Bowel sounds are normal. She exhibits no distension. There is no tenderness.  Musculoskeletal:        Right hip: Normal.       Left hip: Normal.       Cervical back: Normal.       Thoracic back: Normal.       Lumbar back: Normal.  Neurological: She is alert and oriented to person, place, and time. She exhibits normal muscle tone. Coordination normal.  Skin: Skin is warm and dry. No rash noted. No erythema.  Nursing note and vitals reviewed.   ED Course  Procedures (including critical care time) Labs Review Labs Reviewed  BASIC METABOLIC PANEL - Abnormal; Notable for the following:    Glucose, Bld 106 (*)    Creatinine, Ser 1.35 (*)    GFR calc non Af Amer 35 (*)    GFR calc Af Amer 41 (*)    All other components within normal limits  CBC WITH DIFFERENTIAL/PLATELET - Abnormal; Notable for the following:    RBC 3.42 (*)    HCT 35.2 (*)    MCV 102.9 (*)    MCH 35.1 (*)    Neutrophils Relative % 88 (*)    Lymphocytes Relative 10 (*)    Lymphs Abs 0.5 (*)    Monocytes Relative 1 (*)    All other components within normal limits  CBC WITH DIFFERENTIAL/PLATELET    Imaging Review Ct Head Wo Contrast  09/10/2014   CLINICAL DATA:  Post fall from standing now with neck pain. History of dementia. History of breast cancer.  EXAM: CT HEAD WITHOUT CONTRAST  CT CERVICAL SPINE WITHOUT CONTRAST  TECHNIQUE: Multidetector CT imaging of the head and cervical spine was performed following the standard protocol without intravenous contrast. Multiplanar CT image reconstructions of the cervical spine were also generated.  COMPARISON:  PET-CT- 07/02/2014  FINDINGS: CT HEAD FINDINGS  The examination is minimally degraded secondary to patient motion.  Geographic encephalomalacia involving the left frontal and anterior left parietal lobes, the sequela of prior infarction. Rather extensive periventricular hypodensities compatible microvascular ischemic disease. There is mild commensurate expected dilatation the ventricular system, most conspicuously involving the anterior horn of the left lateral ventricle.  Given background parenchymal abnormalities, there is no CT evidence of superimposed acute large territory infarct. No definite intraparenchymal or extra-axial mass or hemorrhage. Normal configuration of the ventricles and basilar cisterns. No midline shift. Intracranial atherosclerosis.  Limited visualization of the paranasal sinuses and mastoid air cells is normal. Regional soft tissues appear normal. Post bilateral cataract surgery. No displaced calvarial fracture.  CT CERVICAL SPINE FINDINGS  C1 to the superior endplate of T2 is imaged.  There is mild straightening the expected cervical lordosis. No anterolisthesis or retrolisthesis. The bilateral facets are normally aligned. The dens is normally positioned between the lateral masses of C1. There is mild degenerative change of the atlantodental articulation. Note is  made of a tiny (3 mm) ossicle adjacent to the left side of the tip of the dens, likely degenerative in etiology. Normal atlantoaxial articulations.  No fracture or static subluxation of cervical spine. Cervical vertebral body heights are preserved. Prevertebral soft tissues are normal.  There is partial ankylosis of the C4 through C6 vertebral bodies. Mild to moderate DDD throughout the remainder of the cervical spine, worse at C3-C4 and C6-C7 with disc space height loss endplate irregularity and small posteriorly directed disc osteophyte complexes at these locations.  There is a slightly spiculated approximately 2.4 x 1.5 cm lymph node within the left side of the base of the neck (image 58, series 5) which correlates with the hypermetabolic lymph node seen on preceding PET-CT obtained 07/02/2014.  The thyroid gland is enlarged and contains multiple mixed attenuating thyroid nodules with dominant nodule within the anterior aspect of the left lobe of the thyroid measuring approximately 2.3 x 1.9 cm. Note is also made of a peripherally calcified approximately 0.8 cm nodule within the left lobe of the  thyroid.  Limited visualization of lung apices is normal.  IMPRESSION: Head CT Impression:  1. Degraded examination without definite acute intracranial process. 2. Sequela of prior left frontal and parietal lobe infarcts, atrophy and advanced microvascular ischemic disease. Cervical spine Impression:  1. No fracture or static subluxation of the cervical spine. 2. Partial ankylosis of the C4 through C6 vertebral bodies. 3. Mild to moderate DDD throughout the remainder of the cervical spine, worse at C3-C4 and C6-C7. 4. Multinodular goiter with dominant nodule within the left lobe of the thyroid measuring 2.3 cm in diameter. Further evaluation with nonemergent thyroid ultrasound could be performed as clinically indicated. 5. Unchanged appearance of known hypermetabolic left supraclavicular lymph node, stable since PET-CT performed 07/02/2014 and again suggestive of metastatic adenopathy.   Electronically Signed   By: Sandi Mariscal M.D.   On: 09/10/2014 19:27   Ct Cervical Spine Wo Contrast  09/10/2014   CLINICAL DATA:  Post fall from standing now with neck pain. History of dementia. History of breast cancer.  EXAM: CT HEAD WITHOUT CONTRAST  CT CERVICAL SPINE WITHOUT CONTRAST  TECHNIQUE: Multidetector CT imaging of the head and cervical spine was performed following the standard protocol without intravenous contrast. Multiplanar CT image reconstructions of the cervical spine were also generated.  COMPARISON:  PET-CT- 07/02/2014  FINDINGS: CT HEAD FINDINGS  The examination is minimally degraded secondary to patient motion.  Geographic encephalomalacia involving the left frontal and anterior left parietal lobes, the sequela of prior infarction. Rather extensive periventricular hypodensities compatible microvascular ischemic disease. There is mild commensurate expected dilatation the ventricular system, most conspicuously involving the anterior horn of the left lateral ventricle. Given background parenchymal  abnormalities, there is no CT evidence of superimposed acute large territory infarct. No definite intraparenchymal or extra-axial mass or hemorrhage. Normal configuration of the ventricles and basilar cisterns. No midline shift. Intracranial atherosclerosis.  Limited visualization of the paranasal sinuses and mastoid air cells is normal. Regional soft tissues appear normal. Post bilateral cataract surgery. No displaced calvarial fracture.  CT CERVICAL SPINE FINDINGS  C1 to the superior endplate of T2 is imaged.  There is mild straightening the expected cervical lordosis. No anterolisthesis or retrolisthesis. The bilateral facets are normally aligned. The dens is normally positioned between the lateral masses of C1. There is mild degenerative change of the atlantodental articulation. Note is made of a tiny (3 mm) ossicle adjacent to the left side of the tip  of the dens, likely degenerative in etiology. Normal atlantoaxial articulations.  No fracture or static subluxation of cervical spine. Cervical vertebral body heights are preserved. Prevertebral soft tissues are normal.  There is partial ankylosis of the C4 through C6 vertebral bodies. Mild to moderate DDD throughout the remainder of the cervical spine, worse at C3-C4 and C6-C7 with disc space height loss endplate irregularity and small posteriorly directed disc osteophyte complexes at these locations.  There is a slightly spiculated approximately 2.4 x 1.5 cm lymph node within the left side of the base of the neck (image 58, series 5) which correlates with the hypermetabolic lymph node seen on preceding PET-CT obtained 07/02/2014.  The thyroid gland is enlarged and contains multiple mixed attenuating thyroid nodules with dominant nodule within the anterior aspect of the left lobe of the thyroid measuring approximately 2.3 x 1.9 cm. Note is also made of a peripherally calcified approximately 0.8 cm nodule within the left lobe of the thyroid.  Limited visualization  of lung apices is normal.  IMPRESSION: Head CT Impression:  1. Degraded examination without definite acute intracranial process. 2. Sequela of prior left frontal and parietal lobe infarcts, atrophy and advanced microvascular ischemic disease. Cervical spine Impression:  1. No fracture or static subluxation of the cervical spine. 2. Partial ankylosis of the C4 through C6 vertebral bodies. 3. Mild to moderate DDD throughout the remainder of the cervical spine, worse at C3-C4 and C6-C7. 4. Multinodular goiter with dominant nodule within the left lobe of the thyroid measuring 2.3 cm in diameter. Further evaluation with nonemergent thyroid ultrasound could be performed as clinically indicated. 5. Unchanged appearance of known hypermetabolic left supraclavicular lymph node, stable since PET-CT performed 07/02/2014 and again suggestive of metastatic adenopathy.   Electronically Signed   By: Sandi Mariscal M.D.   On: 09/10/2014 19:27     EKG Interpretation None      MDM   Final diagnoses:  None   She will be referred back to her primary care Dr. told to return here as needed.  The patient is given the results and all questions were answered   Dalia Heading, PA-C 09/10/14 2138  Tanna Furry, MD 09/18/14 1330

## 2014-09-10 NOTE — Discharge Instructions (Signed)
Return here as needed.  Follow-up with your primary care doctor °

## 2014-09-10 NOTE — ED Notes (Signed)
Pt unable to urinate, Irena Cords PA stated that we could cancel the urinalysis.

## 2014-09-10 NOTE — Progress Notes (Signed)
Hematology and Oncology Follow Up Visit  Erin Beasley [id number] 10/20/1928 81 y.o. 09/10/2014   Principle Diagnosis:   Metastatic breast cancer-ER positive  CVA with residual right-sided weakness  Chronic atrial fibrillation  Current Therapy:   Faslodex 500 mg IM every month Ibrance 100 mg by mouth  (21/7)     Interim History:  [name] is back for followup. She is doing pretty well. She is eating well. She's not having any issues with respect to bleeding. She is on Coumadin for the atrial fibrillation..  Her last CA 27.29 was 48 which is stable.  She's had no fever. She's had no rashes. She's had no headache.  She is on [place]. She seems to be tolerating it pretty well. She is about  a week into already her next cycle of treatment.  There's been no leg swelling.  There's been no chest wall pain.   Overall, her performance status is ECOG 2.    Medications:  Current outpatient prescriptions:  .  calcium carbonate (TUMS - DOSED IN MG ELEMENTAL CALCIUM) 500 MG chewable tablet, Chew 1 tablet by mouth daily., Disp: , Rfl:  .  Cholecalciferol (VITAMIN D PO), Take by mouth every morning. , Disp: , Rfl:  .  diltiazem (CARDIZEM CD) 120 MG 24 hr capsule, TAKE ONE CAPSULE BY MOUTH EVERY DAY, Disp: 30 capsule, Rfl: 3 .  famotidine (PEPCID) 20 MG tablet, Take 20 mg by mouth daily. , Disp: , Rfl:  .  Fulvestrant (FASLODEX IM), Inject into the muscle every 30 (thirty) days., Disp: , Rfl:  .  memantine (NAMENDA) 5 MG tablet, Take 5 mg by mouth 2 (two) times daily., Disp: , Rfl: 6 .  metoprolol (LOPRESSOR) 50 MG tablet, Take 50 mg by mouth 2 (two) times daily. , Disp: , Rfl:  .  palbociclib (IBRANCE) 100 MG capsule, Take 100 mg by mouth as directed. Take whole with food. ON 21 DAYS AND OFF 7 DAYS AND REPEAT., Disp: , Rfl:  .  simvastatin (ZOCOR) 20 MG tablet, Take 20 mg by mouth at bedtime. , Disp: , Rfl:  .  warfarin (COUMADIN) 5 MG tablet, Take 5 mg by mouth daily. ONLY   TAKES ON T-T-S-S NO OTHER DAYS, Disp: , Rfl:  No current facility-administered medications for this visit.  Facility-Administered Medications Ordered in Other Visits:  .  Influenza vac split quadrivalent PF (FLUARIX) injection 0.5 mL, 0.5 mL, Intramuscular, Once, Volanda Napoleon, MD  Allergies: No Known Allergies  Past Medical History, Surgical history, Social history, and Family History were reviewed and updated.  Review of Systems: As above  Physical Exam:  weight is 163 lb (73.936 kg). Her oral temperature is 98.1 F (36.7 C). Her blood pressure is 86/51 and her pulse is 73. Her respiration is 16.   [name]. She has no palpable cervical or supraclavicular lymph nodes.. Lungs are clear. Cardiac exam irregular rate and rhythm consistent with atrial fibrillation. She has no murmurs. Chest wall exam shows marked improvement of the the left chest wall lesion. There is no nodularity. There is no swelling. There is no exudate. There is no bloody discharge. . Abdomen is soft. She has good bowel sounds. There is no palpable liver or spleen. Back exam shows no tenderness over the spine. Extremities shows no clubbing, cyanosis or edema. On the medial aspect of the right foot at the first MT joint, there is a large bunion. This is slightly tender but not erythematous. She has  weakness over on the right side. Skin exam no rashes, ecchymoses or petechia. Neurological exam shows the chronic right-sided weakness. Lab Results  Component Value Date   WBC 3.5* 09/10/2014   HGB 12.2 09/10/2014   HCT 36.6 09/10/2014   MCV 106* 09/10/2014   PLT 219 09/10/2014     Chemistry      Component Value Date/Time   NA 141 09/10/2014 1309   NA 141 08/11/2014 1120   NA 141 01/12/2012 1332   K 4.8* 09/10/2014 1309   K 4.3 08/11/2014 1120   K 4.5 01/12/2012 1332   CL 102 09/10/2014 1309   CL 106 08/11/2014 1120   CL 106 01/12/2012 1332   CO2 28 09/10/2014 1309   CO2 27 08/11/2014 1120     CO2 26 01/12/2012 1332   BUN 15 09/10/2014 1309   BUN 21 08/11/2014 1120   BUN 19.0 01/12/2012 1332   CREATININE 1.7* 09/10/2014 1309   CREATININE 1.19* 08/11/2014 1120   CREATININE 1.1 01/12/2012 1332      Component Value Date/Time   CALCIUM 9.5 09/10/2014 1309   CALCIUM 9.2 08/11/2014 1120   CALCIUM 9.6 01/12/2012 1332   ALKPHOS 46 09/10/2014 1309   ALKPHOS 48 08/11/2014 1120   ALKPHOS 69 01/12/2012 1332   AST 26 09/10/2014 1309   AST 16 08/11/2014 1120   AST 25 01/12/2012 1332   ALT 15 09/10/2014 1309   ALT 8 08/11/2014 1120   ALT 18 01/12/2012 1332   BILITOT 1.00 09/10/2014 1309   BILITOT 0.7 08/11/2014 1120   BILITOT 0.60 01/12/2012 1332         Impression and Plan: Ms. Erin Beasley is a 81 year old female. She has metastatic breast cancer. She is responding nicely.   It is time for another set of scans. I will set her up with a PET scan in late June.  Otherwise, I'm just happy that she is doing well. Her quality of life seems be doing pretty well. She's tolerating the Ibrance nicely.  She will go ahead with her Faslodex today.   Volanda Napoleon, MD 5/26/20162:14 PM is a

## 2014-09-22 ENCOUNTER — Other Ambulatory Visit: Payer: Self-pay | Admitting: Hematology & Oncology

## 2014-09-25 ENCOUNTER — Other Ambulatory Visit: Payer: Self-pay | Admitting: *Deleted

## 2014-09-25 DIAGNOSIS — C50912 Malignant neoplasm of unspecified site of left female breast: Secondary | ICD-10-CM

## 2014-09-25 MED ORDER — PALBOCICLIB 100 MG PO CAPS: 100.0000 mg | ORAL_CAPSULE | ORAL | Status: DC

## 2014-10-12 ENCOUNTER — Encounter (HOSPITAL_COMMUNITY)
Admission: RE | Admit: 2014-10-12 | Discharge: 2014-10-12 | Disposition: A | Discharge status: Home / Self Care | Payer: Medicare Other | Source: Ambulatory Visit | Attending: Hematology & Oncology | Admitting: Hematology & Oncology

## 2014-10-12 DIAGNOSIS — C50919 Malignant neoplasm of unspecified site of unspecified female breast: Secondary | ICD-10-CM | POA: Insufficient documentation

## 2014-10-12 DIAGNOSIS — I2510 Atherosclerotic heart disease of native coronary artery without angina pectoris: Secondary | ICD-10-CM | POA: Diagnosis not present

## 2014-10-12 DIAGNOSIS — R160 Hepatomegaly, not elsewhere classified: Secondary | ICD-10-CM | POA: Diagnosis not present

## 2014-10-12 DIAGNOSIS — J90 Pleural effusion, not elsewhere classified: Secondary | ICD-10-CM | POA: Diagnosis not present

## 2014-10-12 DIAGNOSIS — E041 Nontoxic single thyroid nodule: Secondary | ICD-10-CM | POA: Diagnosis not present

## 2014-10-12 DIAGNOSIS — C50912 Malignant neoplasm of unspecified site of left female breast: Secondary | ICD-10-CM

## 2014-10-12 LAB — GLUCOSE, CAPILLARY: GLUCOSE-CAPILLARY: 66 mg/dL (ref 65–99)

## 2014-10-12 MED ORDER — FLUDEOXYGLUCOSE F - 18 (FDG) INJECTION
8.9000 | Freq: Once | INTRAVENOUS | Status: AC | PRN
  Administered 2014-10-12: 8.9 via INTRAVENOUS

## 2014-10-13 ENCOUNTER — Telehealth: Payer: Self-pay | Admitting: *Deleted

## 2014-10-13 NOTE — Telephone Encounter (Signed)
-----   Message from Volanda Napoleon, MD sent at 10/12/2014  5:46 PM EDT ----- Please call her daughter and tell her that the PET scan is stable. No growth or no worsening activity on PET scan. This is encouraging and what we want to see right now. Thanks

## 2014-10-15 ENCOUNTER — Other Ambulatory Visit (HOSPITAL_BASED_OUTPATIENT_CLINIC_OR_DEPARTMENT_OTHER): Payer: Medicare Other

## 2014-10-15 ENCOUNTER — Encounter: Payer: Self-pay | Admitting: Hematology & Oncology

## 2014-10-15 ENCOUNTER — Ambulatory Visit (HOSPITAL_BASED_OUTPATIENT_CLINIC_OR_DEPARTMENT_OTHER): Payer: Medicare Other | Admitting: Hematology & Oncology

## 2014-10-15 ENCOUNTER — Ambulatory Visit (HOSPITAL_BASED_OUTPATIENT_CLINIC_OR_DEPARTMENT_OTHER): Payer: Medicare Other

## 2014-10-15 VITALS — BP 89/43 | HR 80 | Temp 98.3°F | Resp 16 | Ht 66.0 in | Wt 161.0 lb

## 2014-10-15 DIAGNOSIS — I639 Cerebral infarction, unspecified: Secondary | ICD-10-CM

## 2014-10-15 DIAGNOSIS — Z5111 Encounter for antineoplastic chemotherapy: Secondary | ICD-10-CM

## 2014-10-15 DIAGNOSIS — R64 Cachexia: Secondary | ICD-10-CM

## 2014-10-15 DIAGNOSIS — I482 Chronic atrial fibrillation: Secondary | ICD-10-CM | POA: Diagnosis not present

## 2014-10-15 DIAGNOSIS — Z8673 Personal history of transient ischemic attack (TIA), and cerebral infarction without residual deficits: Secondary | ICD-10-CM

## 2014-10-15 DIAGNOSIS — Z170 Estrogen receptor positive status [ER+]: Secondary | ICD-10-CM

## 2014-10-15 DIAGNOSIS — C50912 Malignant neoplasm of unspecified site of left female breast: Secondary | ICD-10-CM

## 2014-10-15 LAB — CBC WITH DIFFERENTIAL (CANCER CENTER ONLY)
BASO#: 0.1 10*3/uL (ref 0.0–0.2)
BASO%: 1.8 % (ref 0.0–2.0)
EOS%: 1.5 % (ref 0.0–7.0)
Eosinophils Absolute: 0 10*3/uL (ref 0.0–0.5)
HCT: 34.8 % (ref 34.8–46.6)
HGB: 11.7 g/dL (ref 11.6–15.9)
LYMPH#: 1 10*3/uL (ref 0.9–3.3)
LYMPH%: 37.1 % (ref 14.0–48.0)
MCH: 36.2 pg — AB (ref 26.0–34.0)
MCHC: 33.6 g/dL (ref 32.0–36.0)
MCV: 108 fL — AB (ref 81–101)
MONO#: 0.2 10*3/uL (ref 0.1–0.9)
MONO%: 5.5 % (ref 0.0–13.0)
NEUT#: 1.5 10*3/uL (ref 1.5–6.5)
NEUT%: 54.1 % (ref 39.6–80.0)
PLATELETS: 161 10*3/uL (ref 145–400)
RBC: 3.23 10*6/uL — ABNORMAL LOW (ref 3.70–5.32)
RDW: 14.6 % (ref 11.1–15.7)
WBC: 2.7 10*3/uL — AB (ref 3.9–10.0)

## 2014-10-15 LAB — CMP (CANCER CENTER ONLY)
ALT(SGPT): 18 U/L (ref 10–47)
AST: 23 U/L (ref 11–38)
Albumin: 3.4 g/dL (ref 3.3–5.5)
Alkaline Phosphatase: 43 U/L (ref 26–84)
BUN, Bld: 11 mg/dL (ref 7–22)
CO2: 30 mEq/L (ref 18–33)
Calcium: 9 mg/dL (ref 8.0–10.3)
Chloride: 107 mEq/L (ref 98–108)
Creat: 1.3 mg/dl — ABNORMAL HIGH (ref 0.6–1.2)
Glucose, Bld: 81 mg/dL (ref 73–118)
POTASSIUM: 4 meq/L (ref 3.3–4.7)
SODIUM: 142 meq/L (ref 128–145)
TOTAL PROTEIN: 6 g/dL — AB (ref 6.4–8.1)
Total Bilirubin: 1 mg/dl (ref 0.20–1.60)

## 2014-10-15 LAB — PROTIME-INR (CHCC SATELLITE)
INR: 3.3 (ref 2.0–3.5)
Protime: 39.6 Seconds — ABNORMAL HIGH (ref 10.6–13.4)

## 2014-10-15 LAB — CANCER ANTIGEN 27.29: CA 27.29: 45 U/mL — ABNORMAL HIGH (ref 0–39)

## 2014-10-15 MED ORDER — FULVESTRANT 250 MG/5ML IM SOLN
INTRAMUSCULAR | Status: AC
  Filled 2014-10-15: qty 10

## 2014-10-15 MED ORDER — MEGESTROL ACETATE 400 MG/10ML PO SUSP: ORAL | Status: DC

## 2014-10-15 MED ORDER — FULVESTRANT 250 MG/5ML IM SOLN
500.0000 mg | Freq: Once | INTRAMUSCULAR | Status: AC
  Administered 2014-10-15: 500 mg via INTRAMUSCULAR

## 2014-10-15 NOTE — Progress Notes (Signed)
Hematology and Oncology Follow Up Visit  Erin Beasley [id number] 1928/11/04 81 y.o. 10/15/2014   Principle Diagnosis:   Metastatic breast cancer-ER positive  CVA with residual right-sided weakness  Chronic atrial fibrillation  Current Therapy:   Faslodex 500 mg IM every month Ibrance 100 mg by mouth  (21/7)     Interim History:  [name] is back for followup. She is not doing as well. According to Erin Beasley, she is not eating much. She apparently fell and broke Erin neck after she saw [place] a month ago. This happened at home. She sustained a fracture at C2. She did not need surgery. She was not put any kind of brace. Since then, however, she really has not eaten much. She's only lost a couple pounds since Erin last saw Erin.  We did go ahead and do a PET scan on Erin. The PET scan basically showed stable disease. There were no new areas of disease.  Erin last CA 27.29 in May was 58 which is stable.  She's had no nausea or vomiting. She's had no bleeding. There's been no change in bowel or bladder habits. Erin graft Fink[place], when she fell and broke Erin neck, she did not bleed to cause spinal cord impingement.   Overall, Erin performance status is ECOG 2.    Medications:  Current outpatient prescriptions:  .  calcium carbonate (TUMS - DOSED IN MG ELEMENTAL CALCIUM) 500 MG chewable tablet, Chew 1 tablet by mouth daily as needed for indigestion or heartburn. , Disp: , Rfl:  .  Cholecalciferol (VITAMIN D PO), Take 1 tablet by mouth every morning. , Disp: , Rfl:  .  diltiazem (CARDIZEM CD) 120 MG 24 hr capsule, TAKE ONE CAPSULE BY MOUTH EVERY DAY, Disp: 30 capsule, Rfl: 3 .  famotidine (PEPCID) 20 MG tablet, Take 20 mg by mouth daily. , Disp: , Rfl:  .  Fulvestrant (FASLODEX IM), Inject into the muscle every 30 (thirty) days., Disp: , Rfl:  .  memantine (NAMENDA) 5 MG tablet, Take 5 mg by mouth 2 (two) times daily., Disp: , Rfl: 6 .  metoprolol (LOPRESSOR) 50 MG tablet, Take 50 mg by  mouth 2 (two) times daily. , Disp: , Rfl:  .  palbociclib (IBRANCE) 100 MG capsule, Take 1 capsule (100 mg total) by mouth as directed. Take whole with food. ON 21 DAYS AND OFF 7 DAYS AND REPEAT., Disp: 21 capsule, Rfl: 6 .  simvastatin (ZOCOR) 20 MG tablet, Take 20 mg by mouth at bedtime. , Disp: , Rfl:  .  warfarin (COUMADIN) 5 MG tablet, Take 5 mg by mouth See admin instructions. TAKES 8m on Tue / Thur / Sat / Sun ONLY Take no medication on all other days, Disp: , Rfl:  .  megestrol (MEGACE) 400 MG/10ML suspension, Give 1 Tsp a day in the morning for appetite stimulation, Disp: 240 mL, Rfl: 2 No current facility-administered medications for this visit.  Facility-Administered Medications Ordered in Other Visits:  .  Influenza vac split quadrivalent PF (FLUARIX) injection 0.5 mL, 0.5 mL, Intramuscular, Once, Volanda Napoleon, MD  Allergies: No Known Allergies  Past Medical History, Surgical history, Social history, and Beasley History were reviewed and updated.  Review of Systems: As above  Physical Exam:  height is 5\' 6"  (1.676 m) and weight is 161 lb (73.029 kg). Erin oral temperature is 98.3 F (36.8 C). Erin blood pressure is 89/43 and Erin pulse is 80. Erin respiration is 16.   Erin African  American Beasley. She has no palpable cervical or supraclavicular lymph nodes.. Lungs are clear. Cardiac exam irregular rate and rhythm consistent with atrial fibrillation. She has no murmurs. Chest wall exam shows marked improvement of the the left chest wall lesion. There is no nodularity. There is no swelling. There is no exudate. There is no bloody discharge. . Abdomen is soft. She has good bowel sounds. There is no palpable liver or spleen. Back exam shows no tenderness over the spine. Extremities shows no clubbing, cyanosis or edema. On the medial aspect of the right foot at the first MT joint, there is a large bunion. This is slightly tender but not erythematous. She has weakness over on the right  side. Skin exam no rashes, ecchymoses or petechia. Neurological exam shows the chronic right-sided weakness. Lab Results  Component Value Date   WBC 2.7* 10/15/2014   HGB 11.7 10/15/2014   HCT 34.8 10/15/2014   MCV 108* 10/15/2014   PLT 161 10/15/2014     Chemistry      Component Value Date/Time   NA 142 10/15/2014 1445   NA 140 09/10/2014 1945   NA 141 01/12/2012 1332   K 4.0 10/15/2014 1445   K 4.5 09/10/2014 1945   K 4.5 01/12/2012 1332   CL 107 10/15/2014 1445   CL 105 09/10/2014 1945   CL 106 01/12/2012 1332   CO2 30 10/15/2014 1445   CO2 23 09/10/2014 1945   CO2 26 01/12/2012 1332   BUN 11 10/15/2014 1445   BUN 15 09/10/2014 1945   BUN 19.0 01/12/2012 1332   CREATININE 1.3* 10/15/2014 1445   CREATININE 1.35* 09/10/2014 1945   CREATININE 1.1 01/12/2012 1332      Component Value Date/Time   CALCIUM 9.0 10/15/2014 1445   CALCIUM 9.1 09/10/2014 1945   CALCIUM 9.6 01/12/2012 1332   ALKPHOS 43 10/15/2014 1445   ALKPHOS 48 08/11/2014 1120   ALKPHOS 69 01/12/2012 1332   AST 23 10/15/2014 1445   AST 16 08/11/2014 1120   AST 25 01/12/2012 1332   ALT 18 10/15/2014 1445   ALT 8 08/11/2014 1120   ALT 18 01/12/2012 1332   BILITOT 1.00 10/15/2014 1445   BILITOT 0.7 08/11/2014 1120   BILITOT 0.60 01/12/2012 1332         Impression and Plan: Erin Beasley is a 81 year old Beasley. She has metastatic breast cancer. She is responding nicely. I think the PET scan is reassuring.  According to Erin Beasley, she really is not taking the [place]. He says that she chews it and then spits it out. I will like to think that if she actually took the [place], that there would be a very nice effect.  I will go ahead and give Erin a prescription for Megace to try to help with Erin appetite. I think 5 mL a day would be appropriate for Erin.  We will go ahead and plan to get Erin back in one month.  I spent about 30-35 minutes with she and Erin Beasley.  Cassell Smiles,  MD 6/30/20165:26 PM is a

## 2014-10-15 NOTE — Patient Instructions (Signed)
Fulvestrant injection  What is this medicine?  FULVESTRANT (ful VES trant) blocks the effects of estrogen. It is used to treat breast cancer in women past the age of menopause.  This medicine may be used for other purposes; ask your health care provider or pharmacist if you have questions.  COMMON BRAND NAME(S): FASLODEX  What should I tell my health care provider before I take this medicine?  They need to know if you have any of these conditions:  -bleeding problems  -liver disease  -low levels of platelets in the blood  -an unusual or allergic reaction to fulvestrant, other medicines, foods, dyes, or preservatives  -pregnant or trying to get pregnant  -breast-feeding  How should I use this medicine?  This medicine is for injection into a muscle. It is usually given by a health care professional in a hospital or clinic setting.  Talk to your pediatrician regarding the use of this medicine in children. Special care may be needed.  Overdosage: If you think you have taken too much of this medicine contact a poison control center or emergency room at once.  NOTE: This medicine is only for you. Do not share this medicine with others.  What if I miss a dose?  It is important not to miss your dose. Call your doctor or health care professional if you are unable to keep an appointment.  What may interact with this medicine?  -medicines that treat or prevent blood clots like warfarin, enoxaparin, and dalteparin  This list may not describe all possible interactions. Give your health care provider a list of all the medicines, herbs, non-prescription drugs, or dietary supplements you use. Also tell them if you smoke, drink alcohol, or use illegal drugs. Some items may interact with your medicine.  What should I watch for while using this medicine?  Your condition will be monitored carefully while you are receiving this medicine. You will need important blood work done while you are taking this medicine.  Do not become pregnant  while taking this medicine. Women should inform their doctor if they wish to become pregnant or think they might be pregnant. There is a potential for serious side effects to an unborn child. Talk to your health care professional or pharmacist for more information.  What side effects may I notice from receiving this medicine?  Side effects that you should report to your doctor or health care professional as soon as possible:  -allergic reactions like skin rash, itching or hives, swelling of the face, lips, or tongue  -feeling faint or lightheaded, falls  -fever or flu-like symptoms  -sore throat  -vaginal bleeding  Side effects that usually do not require medical attention (report to your doctor or health care professional if they continue or are bothersome):  -aches, pains  -constipation or diarrhea  -headache  -hot flashes  -nausea, vomiting  -pain at site where injected  -stomach pain  This list may not describe all possible side effects. Call your doctor for medical advice about side effects. You may report side effects to FDA at 1-800-FDA-1088.  Where should I keep my medicine?  This drug is given in a hospital or clinic and will not be stored at home.  NOTE: This sheet is a summary. It may not cover all possible information. If you have questions about this medicine, talk to your doctor, pharmacist, or health care provider.   2015, Elsevier/Gold Standard. (2007-08-12 15:39:24)

## 2014-10-16 ENCOUNTER — Telehealth: Payer: Self-pay | Admitting: Hematology & Oncology

## 2014-10-16 NOTE — Telephone Encounter (Signed)
Mailed calendar. °

## 2014-11-17 ENCOUNTER — Ambulatory Visit (HOSPITAL_BASED_OUTPATIENT_CLINIC_OR_DEPARTMENT_OTHER): Payer: Medicare Other

## 2014-11-17 ENCOUNTER — Ambulatory Visit (HOSPITAL_BASED_OUTPATIENT_CLINIC_OR_DEPARTMENT_OTHER): Payer: Medicare Other | Admitting: Hematology & Oncology

## 2014-11-17 ENCOUNTER — Encounter: Payer: Self-pay | Admitting: Hematology & Oncology

## 2014-11-17 ENCOUNTER — Other Ambulatory Visit (HOSPITAL_BASED_OUTPATIENT_CLINIC_OR_DEPARTMENT_OTHER): Payer: Medicare Other

## 2014-11-17 VITALS — BP 90/53 | HR 102 | Temp 97.6°F | Resp 16

## 2014-11-17 DIAGNOSIS — C50912 Malignant neoplasm of unspecified site of left female breast: Secondary | ICD-10-CM

## 2014-11-17 DIAGNOSIS — I482 Chronic atrial fibrillation: Secondary | ICD-10-CM

## 2014-11-17 DIAGNOSIS — I639 Cerebral infarction, unspecified: Secondary | ICD-10-CM

## 2014-11-17 DIAGNOSIS — I4819 Other persistent atrial fibrillation: Secondary | ICD-10-CM

## 2014-11-17 DIAGNOSIS — I481 Persistent atrial fibrillation: Secondary | ICD-10-CM

## 2014-11-17 DIAGNOSIS — R64 Cachexia: Secondary | ICD-10-CM

## 2014-11-17 DIAGNOSIS — Z5111 Encounter for antineoplastic chemotherapy: Secondary | ICD-10-CM | POA: Diagnosis not present

## 2014-11-17 LAB — CMP (CANCER CENTER ONLY)
ALBUMIN: 3.6 g/dL (ref 3.3–5.5)
ALT: 11 U/L (ref 10–47)
AST: 22 U/L (ref 11–38)
Alkaline Phosphatase: 40 U/L (ref 26–84)
BILIRUBIN TOTAL: 0.9 mg/dL (ref 0.20–1.60)
BUN, Bld: 16 mg/dL (ref 7–22)
CHLORIDE: 106 meq/L (ref 98–108)
CO2: 28 mEq/L (ref 18–33)
Calcium: 9.6 mg/dL (ref 8.0–10.3)
Creat: 1.3 mg/dl — ABNORMAL HIGH (ref 0.6–1.2)
GLUCOSE: 103 mg/dL (ref 73–118)
Potassium: 3.9 mEq/L (ref 3.3–4.7)
SODIUM: 147 meq/L — AB (ref 128–145)
Total Protein: 6.9 g/dL (ref 6.4–8.1)

## 2014-11-17 LAB — PROTIME-INR (CHCC SATELLITE)
INR: 2.5 (ref 2.0–3.5)
Protime: 30 Seconds — ABNORMAL HIGH (ref 10.6–13.4)

## 2014-11-17 LAB — CBC WITH DIFFERENTIAL (CANCER CENTER ONLY)
BASO#: 0 10*3/uL (ref 0.0–0.2)
BASO%: 1.7 % (ref 0.0–2.0)
EOS%: 1.3 % (ref 0.0–7.0)
Eosinophils Absolute: 0 10*3/uL (ref 0.0–0.5)
HEMATOCRIT: 34.6 % — AB (ref 34.8–46.6)
HGB: 11.5 g/dL — ABNORMAL LOW (ref 11.6–15.9)
LYMPH#: 0.8 10*3/uL — ABNORMAL LOW (ref 0.9–3.3)
LYMPH%: 33.8 % (ref 14.0–48.0)
MCH: 36.9 pg — AB (ref 26.0–34.0)
MCHC: 33.2 g/dL (ref 32.0–36.0)
MCV: 111 fL — ABNORMAL HIGH (ref 81–101)
MONO#: 0.1 10*3/uL (ref 0.1–0.9)
MONO%: 6 % (ref 0.0–13.0)
NEUT#: 1.3 10*3/uL — ABNORMAL LOW (ref 1.5–6.5)
NEUT%: 57.2 % (ref 39.6–80.0)
Platelets: 100 10*3/uL — ABNORMAL LOW (ref 145–400)
RBC: 3.12 10*6/uL — ABNORMAL LOW (ref 3.70–5.32)
RDW: 14.8 % (ref 11.1–15.7)
WBC: 2.3 10*3/uL — ABNORMAL LOW (ref 3.9–10.0)

## 2014-11-17 MED ORDER — FULVESTRANT 250 MG/5ML IM SOLN
500.0000 mg | Freq: Once | INTRAMUSCULAR | Status: AC
  Administered 2014-11-17: 500 mg via INTRAMUSCULAR

## 2014-11-17 MED ORDER — FULVESTRANT 250 MG/5ML IM SOLN
INTRAMUSCULAR | Status: AC
  Filled 2014-11-17: qty 10

## 2014-11-17 NOTE — Patient Instructions (Signed)
Fulvestrant injection  What is this medicine?  FULVESTRANT (ful VES trant) blocks the effects of estrogen. It is used to treat breast cancer in women past the age of menopause.  This medicine may be used for other purposes; ask your health care provider or pharmacist if you have questions.  COMMON BRAND NAME(S): FASLODEX  What should I tell my health care provider before I take this medicine?  They need to know if you have any of these conditions:  -bleeding problems  -liver disease  -low levels of platelets in the blood  -an unusual or allergic reaction to fulvestrant, other medicines, foods, dyes, or preservatives  -pregnant or trying to get pregnant  -breast-feeding  How should I use this medicine?  This medicine is for injection into a muscle. It is usually given by a health care professional in a hospital or clinic setting.  Talk to your pediatrician regarding the use of this medicine in children. Special care may be needed.  Overdosage: If you think you have taken too much of this medicine contact a poison control center or emergency room at once.  NOTE: This medicine is only for you. Do not share this medicine with others.  What if I miss a dose?  It is important not to miss your dose. Call your doctor or health care professional if you are unable to keep an appointment.  What may interact with this medicine?  -medicines that treat or prevent blood clots like warfarin, enoxaparin, and dalteparin  This list may not describe all possible interactions. Give your health care provider a list of all the medicines, herbs, non-prescription drugs, or dietary supplements you use. Also tell them if you smoke, drink alcohol, or use illegal drugs. Some items may interact with your medicine.  What should I watch for while using this medicine?  Your condition will be monitored carefully while you are receiving this medicine. You will need important blood work done while you are taking this medicine.  Do not become pregnant  while taking this medicine. Women should inform their doctor if they wish to become pregnant or think they might be pregnant. There is a potential for serious side effects to an unborn child. Talk to your health care professional or pharmacist for more information.  What side effects may I notice from receiving this medicine?  Side effects that you should report to your doctor or health care professional as soon as possible:  -allergic reactions like skin rash, itching or hives, swelling of the face, lips, or tongue  -feeling faint or lightheaded, falls  -fever or flu-like symptoms  -sore throat  -vaginal bleeding  Side effects that usually do not require medical attention (report to your doctor or health care professional if they continue or are bothersome):  -aches, pains  -constipation or diarrhea  -headache  -hot flashes  -nausea, vomiting  -pain at site where injected  -stomach pain  This list may not describe all possible side effects. Call your doctor for medical advice about side effects. You may report side effects to FDA at 1-800-FDA-1088.  Where should I keep my medicine?  This drug is given in a hospital or clinic and will not be stored at home.  NOTE: This sheet is a summary. It may not cover all possible information. If you have questions about this medicine, talk to your doctor, pharmacist, or health care provider.   2015, Elsevier/Gold Standard. (2007-08-12 15:39:24)

## 2014-11-17 NOTE — Progress Notes (Signed)
Hematology and Oncology Follow Up Visit  Erin Beasley [id number] 15-Jun-1928 81 y.o. 11/17/2014   Principle Diagnosis:   Metastatic breast cancer-ER positive  CVA with residual right-sided weakness  Chronic atrial fibrillation  Current Therapy:   Faslodex 500 mg IM every month Ibrance 100 mg by mouth  (21/7)     Interim History:  [name] is back for followup. She seems to be doing better. We'll last saw her, she had fallen and broke her neck. Thank you, she did not require any type of surgery. She had no bleeding despite being on Coumadin.  She seems to be eating a little bit better.  She and her family were at a big family reunion this past weekend. She seems to enjoy herself.  Her last tumor marker, CA 27.29, was stable at 45.  She's had no issues with nausea or vomiting.  She's had no change in bowel or bladder habits.  She's had no fever. She's had no cough or shortness of breath. She's had no arm swelling. There's been no leg swelling.    Overall, her performance status is ECOG 2.    Medications:  Current outpatient prescriptions:  .  calcium carbonate (TUMS - DOSED IN MG ELEMENTAL CALCIUM) 500 MG chewable tablet, Chew 1 tablet by mouth daily as needed for indigestion or heartburn. , Disp: , Rfl:  .  Cholecalciferol (VITAMIN D PO), Take 1 tablet by mouth every morning. , Disp: , Rfl:  .  diltiazem (CARDIZEM CD) 120 MG 24 hr capsule, TAKE ONE CAPSULE BY MOUTH EVERY DAY, Disp: 30 capsule, Rfl: 3 .  famotidine (PEPCID) 20 MG tablet, Take 20 mg by mouth daily. , Disp: , Rfl:  .  Fulvestrant (FASLODEX IM), Inject into the muscle every 30 (thirty) days., Disp: , Rfl:  .  megestrol (MEGACE) 400 MG/10ML suspension, Give 1 Tsp a day in the morning for appetite stimulation, Disp: 240 mL, Rfl: 2 .  memantine (NAMENDA) 5 MG tablet, Take 5 mg by mouth 2 (two) times daily., Disp: , Rfl: 6 .  metoprolol (LOPRESSOR) 50 MG tablet, Take 50 mg by mouth 2 (two) times daily. ,  Disp: , Rfl:  .  palbociclib (IBRANCE) 100 MG capsule, Take 1 capsule (100 mg total) by mouth as directed. Take whole with food. ON 21 DAYS AND OFF 7 DAYS AND REPEAT., Disp: 21 capsule, Rfl: 6 .  simvastatin (ZOCOR) 20 MG tablet, Take 20 mg by mouth at bedtime. , Disp: , Rfl:  .  warfarin (COUMADIN) 5 MG tablet, Take 2.5 mg by mouth daily. , Disp: , Rfl:  No current facility-administered medications for this visit.  Facility-Administered Medications Ordered in Other Visits:  .  Influenza vac split quadrivalent PF (FLUARIX) injection 0.5 mL, 0.5 mL, Intramuscular, Once, Volanda Napoleon, MD  Allergies: No Known Allergies  Past Medical History, Surgical history, Social history, and Family History were reviewed and updated.  Review of Systems: As above  Physical Exam:  oral temperature is 97.6 F (36.4 C). Her blood pressure is 90/53 and her pulse is 102. Her respiration is 16.   [name]. She has no palpable cervical or supraclavicular lymph nodes.. Lungs are clear. Cardiac exam irregular rate and rhythm consistent with atrial fibrillation. She has no murmurs. Chest wall exam shows marked improvement of the the left chest wall lesion. There is no nodularity. There is no swelling. There is no exudate. There is no bloody discharge. . Abdomen is soft. She has  good bowel sounds. There is no palpable liver or spleen. Back exam shows no tenderness over the spine. Extremities shows no clubbing, cyanosis or edema. On the medial aspect of the right foot at the first MT joint, there is a large bunion. This is slightly tender but not erythematous. She has weakness over on the right side. Skin exam no rashes, ecchymoses or petechia. Neurological exam shows the chronic right-sided weakness. Lab Results  Component Value Date   WBC 2.3* 11/17/2014   HGB 11.5* 11/17/2014   HCT 34.6* 11/17/2014   MCV 111* 11/17/2014   PLT 100* 11/17/2014     Chemistry      Component Value Date/Time     NA 142 10/15/2014 1445   NA 140 09/10/2014 1945   NA 141 01/12/2012 1332   K 4.0 10/15/2014 1445   K 4.5 09/10/2014 1945   K 4.5 01/12/2012 1332   CL 107 10/15/2014 1445   CL 105 09/10/2014 1945   CL 106 01/12/2012 1332   CO2 30 10/15/2014 1445   CO2 23 09/10/2014 1945   CO2 26 01/12/2012 1332   BUN 11 10/15/2014 1445   BUN 15 09/10/2014 1945   BUN 19.0 01/12/2012 1332   CREATININE 1.3* 10/15/2014 1445   CREATININE 1.35* 09/10/2014 1945   CREATININE 1.1 01/12/2012 1332      Component Value Date/Time   CALCIUM 9.0 10/15/2014 1445   CALCIUM 9.1 09/10/2014 1945   CALCIUM 9.6 01/12/2012 1332   ALKPHOS 43 10/15/2014 1445   ALKPHOS 48 08/11/2014 1120   ALKPHOS 69 01/12/2012 1332   AST 23 10/15/2014 1445   AST 16 08/11/2014 1120   AST 25 01/12/2012 1332   ALT 18 10/15/2014 1445   ALT 8 08/11/2014 1120   ALT 18 01/12/2012 1332   BILITOT 1.00 10/15/2014 1445   BILITOT 0.7 08/11/2014 1120   BILITOT 0.60 01/12/2012 1332         Impression and Plan: Ms. Erin Beasley is a 81 year old female. She has metastatic breast cancer. She is responding nicely. I think the PET scan is reassuring.  She looks quite good. I am glad that she has done well.  I think that as long as we see that she is stable and doing well, I don't think we have to make any changes with our protocol.   We will go ahead and plan to get her back in one month.  I spent about 30-35 minutes with she and her family.  Cassell Smiles, MD 8/2/201611:28 AM is a

## 2014-11-18 LAB — CANCER ANTIGEN 27.29: CA 27.29: 46 U/mL — ABNORMAL HIGH (ref 0–39)

## 2014-12-08 ENCOUNTER — Telehealth: Payer: Self-pay | Admitting: Hematology & Oncology

## 2014-12-08 NOTE — Telephone Encounter (Signed)
[facility] has APPROVED the IBRANCE CAP 100MG  and is good until 12/03/2015.    [id number]         COPY SCANNED

## 2014-12-23 ENCOUNTER — Encounter: Payer: Self-pay | Admitting: Hematology & Oncology

## 2014-12-23 ENCOUNTER — Other Ambulatory Visit (HOSPITAL_BASED_OUTPATIENT_CLINIC_OR_DEPARTMENT_OTHER): Payer: Medicare Other

## 2014-12-23 ENCOUNTER — Ambulatory Visit (HOSPITAL_BASED_OUTPATIENT_CLINIC_OR_DEPARTMENT_OTHER): Payer: Medicare Other | Admitting: Hematology & Oncology

## 2014-12-23 ENCOUNTER — Ambulatory Visit (HOSPITAL_BASED_OUTPATIENT_CLINIC_OR_DEPARTMENT_OTHER): Payer: Medicare Other

## 2014-12-23 VITALS — BP 110/56 | HR 118 | Temp 98.2°F | Resp 14 | Wt 155.0 lb

## 2014-12-23 DIAGNOSIS — C50912 Malignant neoplasm of unspecified site of left female breast: Secondary | ICD-10-CM

## 2014-12-23 DIAGNOSIS — I4819 Other persistent atrial fibrillation: Secondary | ICD-10-CM

## 2014-12-23 DIAGNOSIS — I4891 Unspecified atrial fibrillation: Secondary | ICD-10-CM

## 2014-12-23 DIAGNOSIS — Z8673 Personal history of transient ischemic attack (TIA), and cerebral infarction without residual deficits: Secondary | ICD-10-CM | POA: Diagnosis not present

## 2014-12-23 DIAGNOSIS — I4892 Unspecified atrial flutter: Secondary | ICD-10-CM

## 2014-12-23 DIAGNOSIS — Z170 Estrogen receptor positive status [ER+]: Secondary | ICD-10-CM | POA: Diagnosis not present

## 2014-12-23 DIAGNOSIS — Z5111 Encounter for antineoplastic chemotherapy: Secondary | ICD-10-CM

## 2014-12-23 DIAGNOSIS — C50919 Malignant neoplasm of unspecified site of unspecified female breast: Secondary | ICD-10-CM

## 2014-12-23 LAB — CMP (CANCER CENTER ONLY)
ALBUMIN: 3.7 g/dL (ref 3.3–5.5)
ALK PHOS: 47 U/L (ref 26–84)
ALT(SGPT): 14 U/L (ref 10–47)
AST: 21 U/L (ref 11–38)
BILIRUBIN TOTAL: 0.8 mg/dL (ref 0.20–1.60)
BUN, Bld: 13 mg/dL (ref 7–22)
CALCIUM: 9 mg/dL (ref 8.0–10.3)
CO2: 27 meq/L (ref 18–33)
Chloride: 105 mEq/L (ref 98–108)
Creat: 1.2 mg/dl (ref 0.6–1.2)
Glucose, Bld: 79 mg/dL (ref 73–118)
Potassium: 3.7 mEq/L (ref 3.3–4.7)
Sodium: 141 mEq/L (ref 128–145)
TOTAL PROTEIN: 6.2 g/dL — AB (ref 6.4–8.1)

## 2014-12-23 LAB — CBC WITH DIFFERENTIAL (CANCER CENTER ONLY)
BASO#: 0 10*3/uL (ref 0.0–0.2)
BASO%: 1.4 % (ref 0.0–2.0)
EOS%: 0.5 % (ref 0.0–7.0)
Eosinophils Absolute: 0 10*3/uL (ref 0.0–0.5)
HEMATOCRIT: 32.6 % — AB (ref 34.8–46.6)
HEMOGLOBIN: 10.8 g/dL — AB (ref 11.6–15.9)
LYMPH#: 1 10*3/uL (ref 0.9–3.3)
LYMPH%: 43.7 % (ref 14.0–48.0)
MCH: 36.6 pg — ABNORMAL HIGH (ref 26.0–34.0)
MCHC: 33.1 g/dL (ref 32.0–36.0)
MCV: 111 fL — ABNORMAL HIGH (ref 81–101)
MONO#: 0.3 10*3/uL (ref 0.1–0.9)
MONO%: 13.1 % — ABNORMAL HIGH (ref 0.0–13.0)
NEUT%: 41.3 % (ref 39.6–80.0)
NEUTROS ABS: 0.9 10*3/uL — AB (ref 1.5–6.5)
Platelets: 157 10*3/uL (ref 145–400)
RBC: 2.95 10*6/uL — AB (ref 3.70–5.32)
RDW: 14.9 % (ref 11.1–15.7)
WBC: 2.2 10*3/uL — AB (ref 3.9–10.0)

## 2014-12-23 MED ORDER — FULVESTRANT 250 MG/5ML IM SOLN
500.0000 mg | Freq: Once | INTRAMUSCULAR | Status: AC
  Administered 2014-12-23: 500 mg via INTRAMUSCULAR

## 2014-12-23 MED ORDER — FULVESTRANT 250 MG/5ML IM SOLN
INTRAMUSCULAR | Status: AC
  Filled 2014-12-23: qty 10

## 2014-12-23 NOTE — Patient Instructions (Signed)
Fulvestrant injection  What is this medicine?  FULVESTRANT (ful VES trant) blocks the effects of estrogen. It is used to treat breast cancer in women past the age of menopause.  This medicine may be used for other purposes; ask your health care provider or pharmacist if you have questions.  COMMON BRAND NAME(S): FASLODEX  What should I tell my health care provider before I take this medicine?  They need to know if you have any of these conditions:  -bleeding problems  -liver disease  -low levels of platelets in the blood  -an unusual or allergic reaction to fulvestrant, other medicines, foods, dyes, or preservatives  -pregnant or trying to get pregnant  -breast-feeding  How should I use this medicine?  This medicine is for injection into a muscle. It is usually given by a health care professional in a hospital or clinic setting.  Talk to your pediatrician regarding the use of this medicine in children. Special care may be needed.  Overdosage: If you think you have taken too much of this medicine contact a poison control center or emergency room at once.  NOTE: This medicine is only for you. Do not share this medicine with others.  What if I miss a dose?  It is important not to miss your dose. Call your doctor or health care professional if you are unable to keep an appointment.  What may interact with this medicine?  -medicines that treat or prevent blood clots like warfarin, enoxaparin, and dalteparin  This list may not describe all possible interactions. Give your health care provider a list of all the medicines, herbs, non-prescription drugs, or dietary supplements you use. Also tell them if you smoke, drink alcohol, or use illegal drugs. Some items may interact with your medicine.  What should I watch for while using this medicine?  Your condition will be monitored carefully while you are receiving this medicine. You will need important blood work done while you are taking this medicine.  Do not become pregnant  while taking this medicine. Women should inform their doctor if they wish to become pregnant or think they might be pregnant. There is a potential for serious side effects to an unborn child. Talk to your health care professional or pharmacist for more information.  What side effects may I notice from receiving this medicine?  Side effects that you should report to your doctor or health care professional as soon as possible:  -allergic reactions like skin rash, itching or hives, swelling of the face, lips, or tongue  -feeling faint or lightheaded, falls  -fever or flu-like symptoms  -sore throat  -vaginal bleeding  Side effects that usually do not require medical attention (report to your doctor or health care professional if they continue or are bothersome):  -aches, pains  -constipation or diarrhea  -headache  -hot flashes  -nausea, vomiting  -pain at site where injected  -stomach pain  This list may not describe all possible side effects. Call your doctor for medical advice about side effects. You may report side effects to FDA at 1-800-FDA-1088.  Where should I keep my medicine?  This drug is given in a hospital or clinic and will not be stored at home.  NOTE: This sheet is a summary. It may not cover all possible information. If you have questions about this medicine, talk to your doctor, pharmacist, or health care provider.   2015, Elsevier/Gold Standard. (2007-08-12 15:39:24)

## 2014-12-23 NOTE — Progress Notes (Signed)
Hematology and Oncology Follow Up Visit  Erin Beasley [id number] Jun 30, 1928 81 y.o. 12/23/2014   Principle Diagnosis:   Metastatic breast cancer-ER positive  CVA with residual right-sided weakness  Chronic atrial fibrillation  Current Therapy:   Faslodex 500 mg IM every month Ibrance 100 mg by mouth  (21/7)     Interim History:  [name] is back for followup. She seems to be doing okay. She's had no problems with respect to the CVA.  She's had no problems with Ibrance. She's had no nausea vomiting. She's had no mouth sores. She's had no cough.  Her last CA 27.29 done in early August was 46 and holding stable.  She's had no change in bowel or bladder habits.  She's had no rash.  She had a very quiet Labor Day weekend. It doesn't like she or her family will be doing a whole lot over the next couple months until the holidays.   Overall, her performance status is ECOG 2.    Medications:  Current outpatient prescriptions:  .  calcium carbonate (TUMS - DOSED IN MG ELEMENTAL CALCIUM) 500 MG chewable tablet, Chew 1 tablet by mouth daily as needed for indigestion or heartburn. , Disp: , Rfl:  .  Cholecalciferol (VITAMIN D PO), Take 1 tablet by mouth every morning. , Disp: , Rfl:  .  diltiazem (CARDIZEM CD) 120 MG 24 hr capsule, TAKE ONE CAPSULE BY MOUTH EVERY DAY, Disp: 30 capsule, Rfl: 3 .  famotidine (PEPCID) 20 MG tablet, Take 20 mg by mouth daily. , Disp: , Rfl:  .  Fulvestrant (FASLODEX IM), Inject into the muscle every 30 (thirty) days., Disp: , Rfl:  .  megestrol (MEGACE) 400 MG/10ML suspension, Give 1 Tsp a day in the morning for appetite stimulation, Disp: 240 mL, Rfl: 2 .  memantine (NAMENDA) 5 MG tablet, Take 5 mg by mouth 2 (two) times daily., Disp: , Rfl: 6 .  metoprolol (LOPRESSOR) 50 MG tablet, Take 50 mg by mouth 2 (two) times daily. , Disp: , Rfl:  .  palbociclib (IBRANCE) 100 MG capsule, Take 1 capsule (100 mg total) by mouth as directed. Take whole with  food. ON 21 DAYS AND OFF 7 DAYS AND REPEAT., Disp: 21 capsule, Rfl: 6 .  simvastatin (ZOCOR) 20 MG tablet, Take 20 mg by mouth at bedtime. , Disp: , Rfl:  .  warfarin (COUMADIN) 5 MG tablet, Take 2.5 mg by mouth daily. , Disp: , Rfl:  No current facility-administered medications for this visit.  Facility-Administered Medications Ordered in Other Visits:  .  Influenza vac split quadrivalent PF (FLUARIX) injection 0.5 mL, 0.5 mL, Intramuscular, Once, Volanda Napoleon, MD  Allergies: No Known Allergies  Past Medical History, Surgical history, Social history, and Family History were reviewed and updated.  Review of Systems: As above  Physical Exam:  weight is 155 lb (70.308 kg). Her oral temperature is 98.2 F (36.8 C). Her blood pressure is 110/56 and her pulse is 118. Her respiration is 14.   [name]. She has no palpable cervical or supraclavicular lymph nodes.. Lungs are clear. Cardiac exam irregular rate and rhythm consistent with atrial fibrillation. She has no murmurs. Chest wall exam shows marked improvement of the the left chest wall lesion. There is no nodularity. There is no swelling. There is no exudate. There is no bloody discharge. . Abdomen is soft. She has good bowel sounds. There is no palpable liver or spleen. Back exam shows no tenderness over  the spine. Extremities shows no clubbing, cyanosis or edema. On the medial aspect of the right foot at the first MT joint, there is a large bunion. This is slightly tender but not erythematous. She has weakness over on the right side. Skin exam no rashes, ecchymoses or petechia. Neurological exam shows the chronic right-sided weakness. Lab Results  Component Value Date   WBC 2.2* 12/23/2014   HGB 10.8* 12/23/2014   HCT 32.6* 12/23/2014   MCV 111* 12/23/2014   PLT 157 12/23/2014     Chemistry      Component Value Date/Time   NA 141 12/23/2014 0925   NA 140 09/10/2014 1945   NA 141 01/12/2012 1332   K 3.7  12/23/2014 0925   K 4.5 09/10/2014 1945   K 4.5 01/12/2012 1332   CL 105 12/23/2014 0925   CL 105 09/10/2014 1945   CL 106 01/12/2012 1332   CO2 27 12/23/2014 0925   CO2 23 09/10/2014 1945   CO2 26 01/12/2012 1332   BUN 13 12/23/2014 0925   BUN 15 09/10/2014 1945   BUN 19.0 01/12/2012 1332   CREATININE 1.2 12/23/2014 0925   CREATININE 1.35* 09/10/2014 1945   CREATININE 1.1 01/12/2012 1332      Component Value Date/Time   CALCIUM 9.0 12/23/2014 0925   CALCIUM 9.1 09/10/2014 1945   CALCIUM 9.6 01/12/2012 1332   ALKPHOS 47 12/23/2014 0925   ALKPHOS 48 08/11/2014 1120   ALKPHOS 69 01/12/2012 1332   AST 21 12/23/2014 0925   AST 16 08/11/2014 1120   AST 25 01/12/2012 1332   ALT 14 12/23/2014 0925   ALT 8 08/11/2014 1120   ALT 18 01/12/2012 1332   BILITOT 0.80 12/23/2014 0925   BILITOT 0.7 08/11/2014 1120   BILITOT 0.60 01/12/2012 1332         Impression and Plan: Erin Beasley is a 81 year old female. She has metastatic breast cancer. So far, I think she is doing well. Her CA 27.29 levels have been relatively stable.  Her last PET scan was done in June. I think it is time for [place] to do another one. I will get one set up for her the end of September.  I'll plan to get her back in one month.  I don't think we have to adjust the dose of Ibrance with her.  I spent about 30-35 minutes with she and her family.  Cassell Smiles, MD 9/7/20161:00 PM

## 2014-12-24 LAB — CANCER ANTIGEN 27.29: CA 27.29: 50 U/mL — AB (ref 0–39)

## 2014-12-25 ENCOUNTER — Telehealth: Payer: Self-pay | Admitting: Hematology & Oncology

## 2014-12-25 NOTE — Telephone Encounter (Signed)
Pt in office to see about assistance w IBRANCE. She was approved for the PAN and was awarded a second [employer]. However, when I called to get an extension, they advised, no more monies available.  I gave the daughter Larene [place] other agencies that have patient assistance for that drug.  [facility] was one of the programs and the daughter advised, her mother has already been approved for other services w them and she will call to see if she w qualify for addl services, as well. P: [phone]  Others: [employer]. [phone] [employer] [facility] [phone] [place] x [phone]

## 2015-01-11 ENCOUNTER — Encounter (HOSPITAL_COMMUNITY)
Admission: RE | Admit: 2015-01-11 | Discharge: 2015-01-11 | Disposition: A | Discharge status: Home / Self Care | Payer: Medicare Other | Source: Ambulatory Visit | Attending: Hematology & Oncology | Admitting: Hematology & Oncology

## 2015-01-11 DIAGNOSIS — C50912 Malignant neoplasm of unspecified site of left female breast: Secondary | ICD-10-CM | POA: Diagnosis not present

## 2015-01-11 LAB — GLUCOSE, CAPILLARY: GLUCOSE-CAPILLARY: 78 mg/dL (ref 65–99)

## 2015-01-12 ENCOUNTER — Telehealth: Payer: Self-pay | Admitting: *Deleted

## 2015-01-12 NOTE — Telephone Encounter (Signed)
Spoke to patient's daughter about rescheduling the PET scan that patient wasn't able to tolerate on 9.26. The daughter stated that she wanted to speak with Dr Marin Olp first. The family is unsure about how they want to move forward. She feels like maybe the scans and treatment are no longer beneficial to her mother. She will be present at the next appointment on 01/19/15, but a note was also left for Dr Marin Olp to call daughter to see if they could have some of the discussion over the phone prior to the appointment. Note left with Dr Marin Olp.

## 2015-01-19 ENCOUNTER — Ambulatory Visit (HOSPITAL_BASED_OUTPATIENT_CLINIC_OR_DEPARTMENT_OTHER): Payer: Medicare Other | Admitting: Hematology & Oncology

## 2015-01-19 ENCOUNTER — Ambulatory Visit: Payer: Medicare Other

## 2015-01-19 ENCOUNTER — Encounter: Payer: Self-pay | Admitting: Hematology & Oncology

## 2015-01-19 ENCOUNTER — Other Ambulatory Visit (HOSPITAL_BASED_OUTPATIENT_CLINIC_OR_DEPARTMENT_OTHER): Payer: Medicare Other

## 2015-01-19 VITALS — BP 94/51 | HR 73 | Temp 97.9°F | Resp 18 | Ht 66.0 in | Wt 159.0 lb

## 2015-01-19 DIAGNOSIS — C50012 Malignant neoplasm of nipple and areola, left female breast: Principal | ICD-10-CM

## 2015-01-19 DIAGNOSIS — C50011 Malignant neoplasm of nipple and areola, right female breast: Secondary | ICD-10-CM

## 2015-01-19 DIAGNOSIS — I4891 Unspecified atrial fibrillation: Secondary | ICD-10-CM

## 2015-01-19 DIAGNOSIS — C50912 Malignant neoplasm of unspecified site of left female breast: Secondary | ICD-10-CM

## 2015-01-19 DIAGNOSIS — Z170 Estrogen receptor positive status [ER+]: Secondary | ICD-10-CM | POA: Diagnosis not present

## 2015-01-19 DIAGNOSIS — I4892 Unspecified atrial flutter: Secondary | ICD-10-CM

## 2015-01-19 LAB — PROTIME-INR (CHCC SATELLITE)
INR: 2 (ref 2.0–3.5)
Protime: 24 s — ABNORMAL HIGH (ref 10.6–13.4)

## 2015-01-19 LAB — CBC WITH DIFFERENTIAL (CANCER CENTER ONLY)
BASO#: 0.1 10e3/uL (ref 0.0–0.2)
BASO%: 1.4 % (ref 0.0–2.0)
EOS%: 2.2 % (ref 0.0–7.0)
Eosinophils Absolute: 0.1 10e3/uL (ref 0.0–0.5)
HCT: 33.5 % — ABNORMAL LOW (ref 34.8–46.6)
HGB: 10.9 g/dL — ABNORMAL LOW (ref 11.6–15.9)
LYMPH#: 0.8 10e3/uL — ABNORMAL LOW (ref 0.9–3.3)
LYMPH%: 21.4 % (ref 14.0–48.0)
MCH: 35.6 pg — ABNORMAL HIGH (ref 26.0–34.0)
MCHC: 32.5 g/dL (ref 32.0–36.0)
MCV: 110 fL — ABNORMAL HIGH (ref 81–101)
MONO#: 0.4 10e3/uL (ref 0.1–0.9)
MONO%: 11.9 % (ref 0.0–13.0)
NEUT#: 2.3 10e3/uL (ref 1.5–6.5)
NEUT%: 63.1 % (ref 39.6–80.0)
Platelets: 241 10e3/uL (ref 145–400)
RBC: 3.06 10e6/uL — ABNORMAL LOW (ref 3.70–5.32)
RDW: 12.9 % (ref 11.1–15.7)
WBC: 3.7 10e3/uL — ABNORMAL LOW (ref 3.9–10.0)

## 2015-01-19 LAB — COMPREHENSIVE METABOLIC PANEL (CC13)
ALT: 11 U/L (ref 0–55)
AST: 19 U/L (ref 5–34)
Albumin: 3.1 g/dL — ABNORMAL LOW (ref 3.5–5.0)
Alkaline Phosphatase: 51 U/L (ref 40–150)
Anion Gap: 5 meq/L (ref 3–11)
BUN: 11.3 mg/dL (ref 7.0–26.0)
CO2: 31 meq/L — ABNORMAL HIGH (ref 22–29)
Calcium: 9.1 mg/dL (ref 8.4–10.4)
Chloride: 108 meq/L (ref 98–109)
Creatinine: 1.1 mg/dL (ref 0.6–1.1)
EGFR: 55 ml/min/1.73 m2 — ABNORMAL LOW
Glucose: 85 mg/dL (ref 70–140)
Potassium: 4.2 meq/L (ref 3.5–5.1)
Sodium: 144 meq/L (ref 136–145)
Total Bilirubin: 0.5 mg/dL (ref 0.20–1.20)
Total Protein: 5.9 g/dL — ABNORMAL LOW (ref 6.4–8.3)

## 2015-01-19 MED ORDER — FULVESTRANT 250 MG/5ML IM SOLN
INTRAMUSCULAR | Status: AC
  Filled 2015-01-19: qty 10

## 2015-01-19 NOTE — Progress Notes (Signed)
No treatment per Dr Marin Olp

## 2015-01-19 NOTE — Progress Notes (Signed)
Hematology and Oncology Follow Up Visit  Erin Beasley [id number] 1928-05-08 81 y.o. 01/19/2015   Principle Diagnosis:   Metastatic breast cancer-ER positive  CVA with residual right-sided weakness  Chronic atrial fibrillation  Current Therapy:   Faslodex 500 mg IM every month Ibrance 100 mg by mouth  (21/7)     Interim History:  [name] is back for followup. She is having more problems with respect to her neurological status. She is becoming a little bit more volatile.  We tried to get a PET scan a couple weeks ago. She would not cooperate for this.  I spoke to her daughter by phone a week or so ago. We decided that we probably need to pull back on therapy. Her mortality, in my mind, is based upon the CVA that she had in the neurological issues that she is having.  She does not want to have anymore hair come out. She really is proud of her hair. This is a big issue for her. As such, I think that withholding further therapy would not be a bad idea.  Her last CA 27.29 was 50. This is holding pretty steady.   Overall, her performance status is ECOG 2.  Medications:  Current outpatient prescriptions:  .  calcium carbonate (TUMS - DOSED IN MG ELEMENTAL CALCIUM) 500 MG chewable tablet, Chew 1 tablet by mouth daily as needed for indigestion or heartburn. , Disp: , Rfl:  .  Cholecalciferol (VITAMIN D PO), Take 1 tablet by mouth every morning. , Disp: , Rfl:  .  diltiazem (CARDIZEM CD) 120 MG 24 hr capsule, TAKE ONE CAPSULE BY MOUTH EVERY DAY, Disp: 30 capsule, Rfl: 3 .  famotidine (PEPCID) 20 MG tablet, Take 20 mg by mouth daily. , Disp: , Rfl:  .  Fulvestrant (FASLODEX IM), Inject into the muscle every 30 (thirty) days., Disp: , Rfl:  .  megestrol (MEGACE) 400 MG/10ML suspension, Give 1 Tsp a day in the morning for appetite stimulation, Disp: 240 mL, Rfl: 2 .  memantine (NAMENDA) 5 MG tablet, Take 5 mg by mouth 2 (two) times daily., Disp: , Rfl: 6 .  metoprolol (LOPRESSOR)  50 MG tablet, Take 50 mg by mouth 2 (two) times daily. , Disp: , Rfl:  .  palbociclib (IBRANCE) 100 MG capsule, Take 1 capsule (100 mg total) by mouth as directed. Take whole with food. ON 21 DAYS AND OFF 7 DAYS AND REPEAT., Disp: 21 capsule, Rfl: 6 .  simvastatin (ZOCOR) 20 MG tablet, Take 20 mg by mouth at bedtime. , Disp: , Rfl:  .  warfarin (COUMADIN) 5 MG tablet, Take 2.5 mg by mouth daily. , Disp: , Rfl:  No current facility-administered medications for this visit.  Facility-Administered Medications Ordered in Other Visits:  .  Influenza vac split quadrivalent PF (FLUARIX) injection 0.5 mL, 0.5 mL, Intramuscular, Once, Volanda Napoleon, MD  Allergies: No Known Allergies  Past Medical History, Surgical history, Social history, and Family History were reviewed and updated.  Review of Systems: As above  Physical Exam:  height is 5\' 6"  (1.676 m) and weight is 159 lb (72.122 kg). Her oral temperature is 97.9 F (36.6 C). Her blood pressure is 94/51 and her pulse is 73. Her respiration is 18.   [name]. She has a fullness in the left subclavicular region. No obvious palpable cervical or supraclavicular lymph is appreciated. Lungs are clear. Cardiac exam irregular rate and rhythm consistent with atrial fibrillation. She has no murmurs.  Chest wall exam shows marked improvement of the the left chest wall lesion. There is no nodularity. There is no swelling. There is no exudate. There is no bloody discharge. . Abdomen is soft. She has good bowel sounds. There is no palpable liver or spleen. Back exam shows no tenderness over the spine. Extremities shows no clubbing, cyanosis or edema. On the medial aspect of the right foot at the first MT joint, there is a large bunion. This is slightly tender but not erythematous. She has weakness over on the right side. Skin exam no rashes, ecchymoses or petechia. Neurological exam shows the chronic right-sided weakness. Lab Results    Component Value Date   WBC 3.7* 01/19/2015   HGB 10.9* 01/19/2015   HCT 33.5* 01/19/2015   MCV 110* 01/19/2015   PLT 241 01/19/2015     Chemistry      Component Value Date/Time   NA 144 01/19/2015 1125   NA 141 12/23/2014 0925   NA 140 09/10/2014 1945   K 4.2 01/19/2015 1125   K 3.7 12/23/2014 0925   K 4.5 09/10/2014 1945   CL 105 12/23/2014 0925   CL 105 09/10/2014 1945   CL 106 01/12/2012 1332   CO2 31* 01/19/2015 1125   CO2 27 12/23/2014 0925   CO2 23 09/10/2014 1945   BUN 11.3 01/19/2015 1125   BUN 13 12/23/2014 0925   BUN 15 09/10/2014 1945   CREATININE 1.1 01/19/2015 1125   CREATININE 1.2 12/23/2014 0925   CREATININE 1.35* 09/10/2014 1945      Component Value Date/Time   CALCIUM 9.1 01/19/2015 1125   CALCIUM 9.0 12/23/2014 0925   CALCIUM 9.1 09/10/2014 1945   ALKPHOS 51 01/19/2015 1125   ALKPHOS 47 12/23/2014 0925   ALKPHOS 48 08/11/2014 1120   AST 19 01/19/2015 1125   AST 21 12/23/2014 0925   AST 16 08/11/2014 1120   ALT 11 01/19/2015 1125   ALT 14 12/23/2014 0925   ALT 8 08/11/2014 1120   BILITOT 0.50 01/19/2015 1125   BILITOT 0.80 12/23/2014 0925   BILITOT 0.7 08/11/2014 1120         Impression and Plan: Erin Beasley is a 81 year old female. She has metastatic breast cancer. So far, I think she is doing well. Her CA 27.29 levels have been relatively stable. I think that we need to begin to "pull back" on her therapy. I think that she is becoming more demented and is having more psychological issues. She cannot do the PET scan that we had set up for her in late September.  I think that given her neurological issues, we can just follow her along. Her quality of life is what is important. I talked to her daughter about this. Her daughter agrees.  I will plan to see her back in about 6 weeks. I will have to keep close watch on the left supraclavicular lymph node. I   Volanda Napoleon, MD 10/4/20161:25 PM

## 2015-01-20 LAB — CANCER ANTIGEN 27.29: CA 27.29: 53 U/mL — AB (ref 0–39)

## 2015-01-23 ENCOUNTER — Other Ambulatory Visit: Payer: Self-pay | Admitting: Hematology & Oncology

## 2015-02-08 NOTE — Therapy (Signed)
[facility] [address] [place], [place], [address] Phone: [phone]   Fax:  [phone]  Patient Details  Name: DEONA NOVITSKI MRN: [id number] Date of Birth: [birth year] Referring Provider:  Wenda Low, MD  Encounter Date: 03/27/2014  OCCUPATIONAL THERAPY DISCHARGE SUMMARY  Visits from Start of Care: 1  Current functional level related to goals / functional outcomes: Patient did not return for subsequent OT visits - eval only   Remaining deficits:  Patient did not return for subsequent OT visits  Education / Equipment: See evaluation Plan: Patient agrees to discharge.  Patient goals were met. Patient is being discharged due to meeting the stated rehab goals.  ?????       Mariah Milling , OTR/L  02/08/2015, 12:46 PM  [facility] [address] [place], [place], [address] Phone: [phone]   Fax:  [phone]

## 2015-03-05 ENCOUNTER — Other Ambulatory Visit (HOSPITAL_BASED_OUTPATIENT_CLINIC_OR_DEPARTMENT_OTHER): Payer: Medicare Other

## 2015-03-05 ENCOUNTER — Ambulatory Visit (HOSPITAL_BASED_OUTPATIENT_CLINIC_OR_DEPARTMENT_OTHER): Payer: Medicare Other | Admitting: Hematology & Oncology

## 2015-03-05 ENCOUNTER — Encounter: Payer: Self-pay | Admitting: Hematology & Oncology

## 2015-03-05 VITALS — BP 101/58 | HR 96 | Temp 97.5°F | Resp 14 | Ht 66.0 in | Wt 157.0 lb

## 2015-03-05 DIAGNOSIS — C50011 Malignant neoplasm of nipple and areola, right female breast: Secondary | ICD-10-CM

## 2015-03-05 DIAGNOSIS — I69359 Hemiplegia and hemiparesis following cerebral infarction affecting unspecified side: Secondary | ICD-10-CM | POA: Diagnosis not present

## 2015-03-05 DIAGNOSIS — C50012 Malignant neoplasm of nipple and areola, left female breast: Secondary | ICD-10-CM

## 2015-03-05 DIAGNOSIS — I4891 Unspecified atrial fibrillation: Secondary | ICD-10-CM

## 2015-03-05 LAB — CBC WITH DIFFERENTIAL (CANCER CENTER ONLY)
BASO#: 0 10*3/uL (ref 0.0–0.2)
BASO%: 0.2 % (ref 0.0–2.0)
EOS ABS: 0.1 10*3/uL (ref 0.0–0.5)
EOS%: 1.2 % (ref 0.0–7.0)
HCT: 40.3 % (ref 34.8–46.6)
HGB: 12.9 g/dL (ref 11.6–15.9)
LYMPH#: 1.1 10*3/uL (ref 0.9–3.3)
LYMPH%: 18.4 % (ref 14.0–48.0)
MCH: 31.9 pg (ref 26.0–34.0)
MCHC: 32 g/dL (ref 32.0–36.0)
MCV: 100 fL (ref 81–101)
MONO#: 0.4 10*3/uL (ref 0.1–0.9)
MONO%: 7.4 % (ref 0.0–13.0)
NEUT#: 4.3 10*3/uL (ref 1.5–6.5)
NEUT%: 72.8 % (ref 39.6–80.0)
Platelets: 159 10*3/uL (ref 145–400)
RBC: 4.04 10*6/uL (ref 3.70–5.32)
RDW: 12.7 % (ref 11.1–15.7)
WBC: 5.9 10*3/uL (ref 3.9–10.0)

## 2015-03-05 LAB — COMPREHENSIVE METABOLIC PANEL (CC13)
ALK PHOS: 63 U/L (ref 40–150)
ALT: 9 U/L (ref 0–55)
AST: 22 U/L (ref 5–34)
Albumin: 3.5 g/dL (ref 3.5–5.0)
Anion Gap: 8 mEq/L (ref 3–11)
BILIRUBIN TOTAL: 0.79 mg/dL (ref 0.20–1.20)
BUN: 16.1 mg/dL (ref 7.0–26.0)
CALCIUM: 9.6 mg/dL (ref 8.4–10.4)
CO2: 28 meq/L (ref 22–29)
CREATININE: 1.1 mg/dL (ref 0.6–1.1)
Chloride: 107 mEq/L (ref 98–109)
EGFR: 56 mL/min/{1.73_m2} — ABNORMAL LOW (ref >90)
Glucose: 83 mg/dl (ref 70–140)
Potassium: 4.2 mEq/L (ref 3.5–5.1)
Sodium: 144 mEq/L (ref 136–145)
TOTAL PROTEIN: 6.5 g/dL (ref 6.4–8.3)

## 2015-03-05 LAB — PROTIME-INR (CHCC SATELLITE)
INR: 1.9 — AB (ref 2.0–3.5)
Protime: 22.8 Seconds — ABNORMAL HIGH (ref 10.6–13.4)

## 2015-03-05 NOTE — Progress Notes (Signed)
Hematology and Oncology Follow Up Visit  Erin Beasley [id number] 1929/02/14 81 y.o. 03/05/2015   Principle Diagnosis:   Metastatic breast cancer-ER positive  CVA with residual right-sided weakness  Chronic atrial fibrillation  Current Therapy:   Observation     Interim History:  [name] is back for followup. She is now off therapy. We got her off the Faslodex and Ibrance. She was losing some of her hair. This really was upsetting for her. As such, I felt that her quality of life to be better off treatment.  She seems to be doing better. She is not as emotional. She does not get as upset area and  Her last CA 27.29 was stable at 53.  She has had no obvious complaints that her family can tell. She's not hurting. She is eating. She's had no cough. She is not bleeding. She's had no change in bowel or bladder habits.  She is looking forward to the upcoming holidays.  She has had no rashes.  She is on Coumadin. She is doing well with Coumadin for her atrial fibrillation.  Overall, her performance status is ECOG 2.  Medications:  Current outpatient prescriptions:  .  calcium carbonate (TUMS - DOSED IN MG ELEMENTAL CALCIUM) 500 MG chewable tablet, Chew 1 tablet by mouth daily as needed for indigestion or heartburn. , Disp: , Rfl:  .  Cholecalciferol (VITAMIN D PO), Take 1 tablet by mouth every morning. , Disp: , Rfl:  .  diltiazem (CARDIZEM CD) 120 MG 24 hr capsule, TAKE ONE CAPSULE BY MOUTH EVERY DAY, Disp: 30 capsule, Rfl: 3 .  famotidine (PEPCID) 20 MG tablet, Take 20 mg by mouth daily. , Disp: , Rfl:  .  Fulvestrant (FASLODEX IM), Inject into the muscle every 30 (thirty) days., Disp: , Rfl:  .  megestrol (MEGACE) 400 MG/10ML suspension, Give 1 Tsp a day in the morning for appetite stimulation, Disp: 240 mL, Rfl: 2 .  memantine (NAMENDA) 5 MG tablet, Take 5 mg by mouth 2 (two) times daily., Disp: , Rfl: 6 .  metoprolol (LOPRESSOR) 50 MG tablet, Take 50 mg by mouth 2  (two) times daily. , Disp: , Rfl:  .  palbociclib (IBRANCE) 100 MG capsule, Take 1 capsule (100 mg total) by mouth as directed. Take whole with food. ON 21 DAYS AND OFF 7 DAYS AND REPEAT., Disp: 21 capsule, Rfl: 6 .  simvastatin (ZOCOR) 20 MG tablet, Take 20 mg by mouth at bedtime. , Disp: , Rfl:  .  warfarin (COUMADIN) 5 MG tablet, Take 2.5 mg by mouth daily. , Disp: , Rfl:  No current facility-administered medications for this visit.  Facility-Administered Medications Ordered in Other Visits:  .  Influenza vac split quadrivalent PF (FLUARIX) injection 0.5 mL, 0.5 mL, Intramuscular, Once, Volanda Napoleon, MD  Allergies: No Known Allergies  Past Medical History, Surgical history, Social history, and Family History were reviewed and updated.  Review of Systems: As above  Physical Exam:  height is 5\' 6"  (1.676 m) and weight is 157 lb (71.215 kg). Her oral temperature is 97.5 F (36.4 C). Her blood pressure is 101/58 and her pulse is 96. Her respiration is 14.   [name]. She has a fullness in the left subclavicular region. No obvious palpable cervical or supraclavicular lymph is appreciated. Lungs are clear. Cardiac exam irregular rate and rhythm consistent with atrial fibrillation. She has no murmurs. Chest wall exam shows marked improvement of the the left chest wall  lesion. There is no nodularity. There is no swelling. There is no exudate. There is no bloody discharge. . Abdomen is soft. She has good bowel sounds. There is no palpable liver or spleen. Back exam shows no tenderness over the spine. Extremities shows no clubbing, cyanosis or edema. On the medial aspect of the right foot at the first MT joint, there is a large bunion. This is slightly tender but not erythematous. She has weakness over on the right side. Skin exam no rashes, ecchymoses or petechia. Neurological exam shows the chronic right-sided weakness. Lab Results  Component Value Date   WBC 5.9  03/05/2015   HGB 12.9 03/05/2015   HCT 40.3 03/05/2015   MCV 100 03/05/2015   PLT 159 03/05/2015     Chemistry      Component Value Date/Time   NA 144 01/19/2015 1125   NA 141 12/23/2014 0925   NA 140 09/10/2014 1945   K 4.2 01/19/2015 1125   K 3.7 12/23/2014 0925   K 4.5 09/10/2014 1945   CL 105 12/23/2014 0925   CL 105 09/10/2014 1945   CL 106 01/12/2012 1332   CO2 31* 01/19/2015 1125   CO2 27 12/23/2014 0925   CO2 23 09/10/2014 1945   BUN 11.3 01/19/2015 1125   BUN 13 12/23/2014 0925   BUN 15 09/10/2014 1945   CREATININE 1.1 01/19/2015 1125   CREATININE 1.2 12/23/2014 0925   CREATININE 1.35* 09/10/2014 1945      Component Value Date/Time   CALCIUM 9.1 01/19/2015 1125   CALCIUM 9.0 12/23/2014 0925   CALCIUM 9.1 09/10/2014 1945   ALKPHOS 51 01/19/2015 1125   ALKPHOS 47 12/23/2014 0925   ALKPHOS 48 08/11/2014 1120   AST 19 01/19/2015 1125   AST 21 12/23/2014 0925   AST 16 08/11/2014 1120   ALT 11 01/19/2015 1125   ALT 14 12/23/2014 0925   ALT 8 08/11/2014 1120   BILITOT 0.50 01/19/2015 1125   BILITOT 0.80 12/23/2014 0925   BILITOT 0.7 08/11/2014 1120         Impression and Plan: Erin Beasley is a 81 year old female. She has metastatic breast cancer. So far, I think she is doing well. Her CA 27.29 levels have been relatively stable. I think that she is doing pretty well. It seems as if her mental status is better. She is not as "emotional". I think that her quality of life is better.  The left supraclavicular lymph node does not appear to be any larger by my exam.  I think we probably get her back now in about 2 months. I want to try to get her through all the holidays and to get her back in January.   Volanda Napoleon, MD 11/18/20169:49 AM

## 2015-03-06 LAB — CANCER ANTIGEN 27.29: CA 27.29: 98 U/mL — ABNORMAL HIGH (ref 0–39)

## 2015-05-06 ENCOUNTER — Ambulatory Visit: Payer: Medicare Other

## 2015-05-06 ENCOUNTER — Encounter: Payer: Self-pay | Admitting: Hematology & Oncology

## 2015-05-06 ENCOUNTER — Ambulatory Visit (HOSPITAL_BASED_OUTPATIENT_CLINIC_OR_DEPARTMENT_OTHER): Payer: Medicare Other | Admitting: Hematology & Oncology

## 2015-05-06 ENCOUNTER — Other Ambulatory Visit (HOSPITAL_BASED_OUTPATIENT_CLINIC_OR_DEPARTMENT_OTHER): Payer: Medicare Other

## 2015-05-06 VITALS — BP 107/74 | HR 109 | Temp 97.9°F | Resp 14 | Ht 66.0 in | Wt 154.0 lb

## 2015-05-06 DIAGNOSIS — C50919 Malignant neoplasm of unspecified site of unspecified female breast: Secondary | ICD-10-CM

## 2015-05-06 DIAGNOSIS — I69359 Hemiplegia and hemiparesis following cerebral infarction affecting unspecified side: Secondary | ICD-10-CM

## 2015-05-06 DIAGNOSIS — C50012 Malignant neoplasm of nipple and areola, left female breast: Secondary | ICD-10-CM

## 2015-05-06 DIAGNOSIS — C50011 Malignant neoplasm of nipple and areola, right female breast: Secondary | ICD-10-CM

## 2015-05-06 DIAGNOSIS — I4891 Unspecified atrial fibrillation: Secondary | ICD-10-CM

## 2015-05-06 LAB — COMPREHENSIVE METABOLIC PANEL
ALK PHOS: 71 U/L (ref 40–150)
ALT: 9 U/L (ref 0–55)
ANION GAP: 8 meq/L (ref 3–11)
AST: 22 U/L (ref 5–34)
Albumin: 3.5 g/dL (ref 3.5–5.0)
BILIRUBIN TOTAL: 0.45 mg/dL (ref 0.20–1.20)
BUN: 19.6 mg/dL (ref 7.0–26.0)
CALCIUM: 9.6 mg/dL (ref 8.4–10.4)
CO2: 29 meq/L (ref 22–29)
Chloride: 109 mEq/L (ref 98–109)
Creatinine: 1.1 mg/dL (ref 0.6–1.1)
EGFR: 54 mL/min/{1.73_m2} — AB (ref >90)
Glucose: 85 mg/dl (ref 70–140)
Potassium: 4.1 mEq/L (ref 3.5–5.1)
Sodium: 146 mEq/L — ABNORMAL HIGH (ref 136–145)
TOTAL PROTEIN: 7 g/dL (ref 6.4–8.3)

## 2015-05-06 LAB — CBC WITH DIFFERENTIAL (CANCER CENTER ONLY)
BASO#: 0 10*3/uL (ref 0.0–0.2)
BASO%: 0.3 % (ref 0.0–2.0)
EOS ABS: 0.1 10*3/uL (ref 0.0–0.5)
EOS%: 1.5 % (ref 0.0–7.0)
HEMATOCRIT: 42.4 % (ref 34.8–46.6)
HGB: 13.3 g/dL (ref 11.6–15.9)
LYMPH#: 1.3 10*3/uL (ref 0.9–3.3)
LYMPH%: 21.4 % (ref 14.0–48.0)
MCH: 29.3 pg (ref 26.0–34.0)
MCHC: 31.4 g/dL — ABNORMAL LOW (ref 32.0–36.0)
MCV: 93 fL (ref 81–101)
MONO#: 0.5 10*3/uL (ref 0.1–0.9)
MONO%: 8.2 % (ref 0.0–13.0)
NEUT#: 4.1 10*3/uL (ref 1.5–6.5)
NEUT%: 68.6 % (ref 39.6–80.0)
PLATELETS: 179 10*3/uL (ref 145–400)
RBC: 4.54 10*6/uL (ref 3.70–5.32)
RDW: 13.8 % (ref 11.1–15.7)
WBC: 6 10*3/uL (ref 3.9–10.0)

## 2015-05-06 NOTE — Progress Notes (Signed)
Hematology and Oncology Follow Up Visit  Erin Beasley [id number] February 28, 1929 82 y.o. 05/06/2015   Principle Diagnosis:   Metastatic breast cancer-ER positive  CVA with residual right-sided weakness  Chronic atrial fibrillation  Current Therapy:   Observation     Interim History:  [name] is back for followup. She is now off therapy. We got her off the Faslodex and Ibrance. She was losing some of her hair. This really was upsetting for her. As such, I felt that her quality of life to be better off treatment.  She had a pretty good Christmas. She went to visit her family. She had family in [place].  Unfortunate, her last CA 27.29 went from 53 up to 98. I think this is indicative of activity of her breast cancer. Her left superficial lymph node survey. His to be more prominent.  She's had no cough. She's had no bleeding. She is on Coumadin for the CVA.  She's had no weakness over in the left arm. She's had no change in bowel or bladder habits.  Overall, her performance status is ECOG 2.  Medications:  Current outpatient prescriptions:  .  calcium carbonate (TUMS - DOSED IN MG ELEMENTAL CALCIUM) 500 MG chewable tablet, Chew 1 tablet by mouth daily as needed for indigestion or heartburn. , Disp: , Rfl:  .  Cholecalciferol (VITAMIN D PO), Take 1 tablet by mouth every morning. , Disp: , Rfl:  .  diltiazem (CARDIZEM CD) 120 MG 24 hr capsule, TAKE ONE CAPSULE BY MOUTH EVERY DAY, Disp: 30 capsule, Rfl: 3 .  famotidine (PEPCID) 20 MG tablet, Take 20 mg by mouth daily. , Disp: , Rfl:  .  Fulvestrant (FASLODEX IM), Inject into the muscle every 30 (thirty) days., Disp: , Rfl:  .  megestrol (MEGACE) 400 MG/10ML suspension, Give 1 Tsp a day in the morning for appetite stimulation, Disp: 240 mL, Rfl: 2 .  memantine (NAMENDA) 5 MG tablet, Take 5 mg by mouth 2 (two) times daily., Disp: , Rfl: 6 .  metoprolol (LOPRESSOR) 50 MG tablet, Take 50 mg by mouth 2 (two) times daily. ,  Disp: , Rfl:  .  palbociclib (IBRANCE) 100 MG capsule, Take 1 capsule (100 mg total) by mouth as directed. Take whole with food. ON 21 DAYS AND OFF 7 DAYS AND REPEAT., Disp: 21 capsule, Rfl: 6 .  simvastatin (ZOCOR) 20 MG tablet, Take 20 mg by mouth at bedtime. , Disp: , Rfl:  .  warfarin (COUMADIN) 5 MG tablet, Take 2.5 mg by mouth daily. , Disp: , Rfl:  No current facility-administered medications for this visit.  Facility-Administered Medications Ordered in Other Visits:  .  Influenza vac split quadrivalent PF (FLUARIX) injection 0.5 mL, 0.5 mL, Intramuscular, Once, Volanda Napoleon, MD  Allergies: No Known Allergies  Past Medical History, Surgical history, Social history, and Family History were reviewed and updated.  Review of Systems: As above  Physical Exam:  height is 5\' 6"  (1.676 m) and weight is 154 lb (69.854 kg). Her oral temperature is 97.9 F (36.6 C). Her blood pressure is 107/74 and her pulse is 109. Her respiration is 14.   [name]. She has a discrete lymph node in the left subclavicular region. It probably measures about 1.5 x 2 cm. No obvious palpable cervical or supraclavicular lymph is appreciated. Lungs are clear. Cardiac exam irregular rate and rhythm consistent with atrial fibrillation. She has no murmurs. Chest wall exam shows marked improvement of  the the left chest wall lesion. There is no nodularity. There is no swelling. There is no exudate. There is no bloody discharge. . Abdomen is soft. She has good bowel sounds. There is no palpable liver or spleen. Back exam shows no tenderness over the spine. Extremities shows no clubbing, cyanosis or edema. On the medial aspect of the right foot at the first MT joint, there is a large bunion. This is slightly tender but not erythematous. She has weakness over on the right side. Skin exam no rashes, ecchymoses or petechia. Neurological exam shows the chronic right-sided weakness. Lab Results  Component  Value Date   WBC 6.0 05/06/2015   HGB 13.3 05/06/2015   HCT 42.4 05/06/2015   MCV 93 05/06/2015   PLT 179 05/06/2015     Chemistry      Component Value Date/Time   NA 144 03/05/2015 0920   NA 141 12/23/2014 0925   NA 140 09/10/2014 1945   K 4.2 03/05/2015 0920   K 3.7 12/23/2014 0925   K 4.5 09/10/2014 1945   CL 105 12/23/2014 0925   CL 105 09/10/2014 1945   CL 106 01/12/2012 1332   CO2 28 03/05/2015 0920   CO2 27 12/23/2014 0925   CO2 23 09/10/2014 1945   BUN 16.1 03/05/2015 0920   BUN 13 12/23/2014 0925   BUN 15 09/10/2014 1945   CREATININE 1.1 03/05/2015 0920   CREATININE 1.2 12/23/2014 0925   CREATININE 1.35* 09/10/2014 1945      Component Value Date/Time   CALCIUM 9.6 03/05/2015 0920   CALCIUM 9.0 12/23/2014 0925   CALCIUM 9.1 09/10/2014 1945   ALKPHOS 63 03/05/2015 0920   ALKPHOS 47 12/23/2014 0925   ALKPHOS 48 08/11/2014 1120   AST 22 03/05/2015 0920   AST 21 12/23/2014 0925   AST 16 08/11/2014 1120   ALT 9 03/05/2015 0920   ALT 14 12/23/2014 0925   ALT 8 08/11/2014 1120   BILITOT 0.79 03/05/2015 0920   BILITOT 0.80 12/23/2014 0925   BILITOT 0.7 08/11/2014 1120         Impression and Plan: Erin Beasley is a 82 year old female. She has metastatic breast cancer. So far, I think she is doing well.    Unfortunately, I think that her cancer is progressing. Her last CA 27.29 was 89. Her left subclavicular lymph node is larger. I'm worried that this will start to affect the brachial plexus on the left side. Her left arm is her functional arm.  I spoke with Dr. Sondra Come of radiation oncology. He will see her next week. I does she had a tough time with the last course of radiation but now that she knows what to expect, I think that she should do okay.   I will plan to get her back in 6 weeks now.  We probably will have to do another PET scan on her in the spring.  I spent about 30 minutes with them.    Volanda Napoleon, MD 1/19/20179:13 AM

## 2015-05-07 ENCOUNTER — Telehealth: Payer: Self-pay | Admitting: Oncology

## 2015-05-07 LAB — CANCER ANTIGEN 27.29: CA 27.29: 205.8 U/mL — ABNORMAL HIGH (ref 0.0–38.6)

## 2015-05-07 LAB — CANCER ANTIGEN 27-29 (PARALLEL TESTING): CA 27.29: 217 U/mL — AB (ref 0–39)

## 2015-05-07 LAB — VITAMIN D 25 HYDROXY (VIT D DEFICIENCY, FRACTURES): VIT D 25 HYDROXY: 55.3 ng/mL (ref 30.0–100.0)

## 2015-05-07 NOTE — Progress Notes (Signed)
Histology and Location of Primary Cancer: Metastatic left breast cancer-ER positive   Location(s) of Symptomatic tumor(s): left subclavicular lymph node   Past/Anticipated chemotherapy by medical oncology, if any: She recently stopped taking Faslodex and Ibrance.  Patient's main complaints related to symptomatic tumor(s) are: lymph node was palpated by Dr. Marin Olp.  Pain on a scale of 0-10 is: 0  Ambulatory status? Walker? Wheelchair?: wheelchair  SAFETY ISSUES:  Prior radiation? 12/10/13-12/24/13 -Left lateral chest wall mass 30 gray   Pacemaker/ICD? no  Possible current pregnancy? no  Is the patient on methotrexate? no  Additional Complaints / other details:  Patient is here with her husband and daughter.  BP 100/59 mmHg  Pulse 79  Temp(Src) 98.6 F (37 C) (Oral)  Resp 16  Ht 5\' 6"  (1.676 m)  Wt 159 lb 9.6 oz (72.394 kg)  BMI 25.77 kg/m2  SpO2 99%

## 2015-05-10 ENCOUNTER — Encounter: Payer: Self-pay | Admitting: Radiation Oncology

## 2015-05-10 ENCOUNTER — Ambulatory Visit
Admission: RE | Admit: 2015-05-10 | Discharge: 2015-05-10 | Disposition: A | Discharge status: Home / Self Care | Payer: Medicare Other | Source: Ambulatory Visit | Attending: Radiation Oncology | Admitting: Radiation Oncology

## 2015-05-10 VITALS — BP 100/59 | HR 79 | Temp 98.6°F | Resp 16 | Ht 66.0 in | Wt 159.6 lb

## 2015-05-10 DIAGNOSIS — C770 Secondary and unspecified malignant neoplasm of lymph nodes of head, face and neck: Secondary | ICD-10-CM | POA: Insufficient documentation

## 2015-05-10 DIAGNOSIS — Z510 Encounter for antineoplastic radiation therapy: Secondary | ICD-10-CM | POA: Diagnosis present

## 2015-05-10 DIAGNOSIS — C50919 Malignant neoplasm of unspecified site of unspecified female breast: Secondary | ICD-10-CM | POA: Diagnosis present

## 2015-05-10 DIAGNOSIS — Z923 Personal history of irradiation: Secondary | ICD-10-CM | POA: Insufficient documentation

## 2015-05-10 NOTE — Progress Notes (Signed)
Radiation Oncology         (336) [phone] ________________________________  Name: Erin Beasley MRN: [id number]  Date: 05/10/2015  DOB: 10-14-1928  Follow-Up Visit Note  CC: Wenda Low, MD  Wenda Low, MD    ICD-9-CM ICD-10-CM   1. 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 Marin Olp. The patient has been off treatment recently in light of side effects associated with Faslodex and Ibrance.   She has had a slowly growing left supraclavicular mass and the patient's CA 27.29 has increased from 53 up to 98. In light of the location of lesion and potential for pain and neurologic compromise the patient is referred to radiation oncology for consideration for additional treatment.                       ALLERGIES:  is allergic to misc natural products and penicillins.  Meds: Current Outpatient Prescriptions  Medication Sig Dispense Refill  . calcium carbonate (TUMS - DOSED IN MG ELEMENTAL CALCIUM) 500 MG chewable tablet Chew 1 tablet by mouth daily as needed for indigestion or heartburn.     . Cholecalciferol (VITAMIN D PO) Take 1 tablet by mouth every morning.     . diltiazem (CARDIZEM CD) 120 MG 24 hr capsule TAKE ONE CAPSULE BY MOUTH EVERY DAY 30 capsule 3  . famotidine (PEPCID) 20 MG tablet Take 20 mg by mouth daily.     . memantine (NAMENDA) 5 MG tablet Take 5 mg by mouth 2 (two) times daily.  6  . metoprolol (LOPRESSOR) 50 MG tablet Take 50 mg by mouth 2 (two) times daily.     . simvastatin (ZOCOR) 20 MG tablet Take 20 mg by mouth at bedtime.     Marland Kitchen warfarin (COUMADIN) 5 MG tablet Take 2.5 mg by mouth daily.     .  Fulvestrant (FASLODEX IM) Inject into the muscle every 30 (thirty) days. Reported on 05/10/2015    . megestrol (MEGACE) 400 MG/10ML suspension Give 1 Tsp a day in the morning for appetite stimulation (Patient not taking: Reported on 05/10/2015) 240 mL 2  . palbociclib (IBRANCE) 100 MG capsule Take 1 capsule (100 mg total) by mouth as directed. Take whole with food. ON 21 DAYS AND OFF 7 DAYS AND REPEAT. (Patient not taking: Reported on 05/10/2015) 21 capsule 6   No current facility-administered medications for this encounter.   Facility-Administered Medications Ordered in Other Encounters  Medication Dose Route Frequency Provider Last Rate Last Dose  . Influenza vac split quadrivalent PF (FLUARIX) injection 0.5 mL  0.5 mL Intramuscular Once Volanda Napoleon, MD        Physical Findings: The patient is in no acute distress. Patient is alert and oriented.  height is 5\' 6"  (1.676 m) and weight is 159 lb 9.6 oz (72.394 kg). Her oral temperature is 98.6 F (37 C). Her blood pressure is 100/59 and her pulse is 79. Her respiration is 16 and oxygen saturation is 99%. .  She is accompanied by her husband and daughter on evaluation. The lungs are clear. The heart has an irregular rhythm consistent with atrial fibrillation.  Examination of left chest wall area reveals good response to her previous palliative radiation therapy well without any significant chest wall mass. The left supraclavicular region reveals a 4 x 3.5 cm hard mass consistent with metastatic breast cancer. This is fixed to surrounding structures  Lab Findings: Lab Results  Component Value Date   WBC 6.0 05/06/2015   HGB 13.3 05/06/2015   HCT 42.4 05/06/2015   MCV 93 05/06/2015   PLT 179 05/06/2015    Radiographic Findings: No results found.  Impression:  Metastatic breast cancer to the left supraclavicular fossa. The patient would be a good candidate for a short course of palliative radiation therapy directed at this area. I discussed  treatment course side effects and potential toxicities of radiation therapy with the patient and her family. She appears to understand wishes to proceed with planned course of treatment.  Plan:  Simulation and planning tomorrow with treatments to begin in approximately 1 week. Anticipate 10 treatments to the area of concern  ____________________________________ Gery Pray, MD

## 2015-05-11 ENCOUNTER — Ambulatory Visit
Admission: RE | Admit: 2015-05-11 | Discharge: 2015-05-11 | Disposition: A | Discharge status: Home / Self Care | Payer: Medicare Other | Source: Ambulatory Visit | Attending: Radiation Oncology | Admitting: Radiation Oncology

## 2015-05-11 DIAGNOSIS — Z510 Encounter for antineoplastic radiation therapy: Secondary | ICD-10-CM | POA: Diagnosis not present

## 2015-05-11 DIAGNOSIS — C770 Secondary and unspecified malignant neoplasm of lymph nodes of head, face and neck: Secondary | ICD-10-CM

## 2015-05-12 NOTE — Addendum Note (Signed)
Encounter addended by: Jacqulyn Liner, RN on: 05/12/2015 11:10 AM<BR>     Documentation filed: Charges VN

## 2015-05-12 NOTE — Progress Notes (Signed)
  Radiation Oncology         (336) [phone] ________________________________  Name: ZALEAH HUETTER MRN: [id number]  Date: 05/11/2015  DOB: [birth year]  SIMULATION AND TREATMENT PLANNING NOTE    ICD-9-CM ICD-10-CM   1. 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  Identity was confirmed.  All relevant records and images related to the planned course of therapy were reviewed.  The patient freely provided informed written consent to proceed with treatment after reviewing the details related to the planned course of therapy. The consent form was witnessed and verified by the simulation staff.  Then, the patient was set-up in a stable reproducible  neck extended position for radiation therapy.  CT images were obtained.  Surface markings were placed.  The CT images were loaded into the planning software.  Then the target and avoidance structures were contoured.  Treatment planning then occurred.  The radiation prescription was entered and confirmed.  Then, I designed and supervised the construction of a total of 4 medically necessary complex treatment devices.  I have requested : Isodose Plan.  I have ordered:dose calc.  PLAN:  The patient will receive 30 Gy in 10 fractions.  ________________________________ Special treatment procedure note:  Patient has received previous treatment to the left chest wall area. Given the potential for overlap with the current plan radiation field and  additional time taken in reviewing the patient's prior treatment, this constitutes a special treatment procedure -----------------------------------  Blair Promise, PhD, MD

## 2015-05-17 DIAGNOSIS — Z510 Encounter for antineoplastic radiation therapy: Secondary | ICD-10-CM | POA: Diagnosis not present

## 2015-05-18 ENCOUNTER — Ambulatory Visit
Admission: RE | Admit: 2015-05-18 | Discharge: 2015-05-18 | Disposition: A | Discharge status: Home / Self Care | Payer: Medicare Other | Source: Ambulatory Visit | Attending: Radiation Oncology | Admitting: Radiation Oncology

## 2015-05-18 ENCOUNTER — Encounter: Payer: Self-pay | Admitting: Radiation Oncology

## 2015-05-18 VITALS — BP 94/63 | HR 61 | Ht 66.0 in | Wt 154.1 lb

## 2015-05-18 DIAGNOSIS — C770 Secondary and unspecified malignant neoplasm of lymph nodes of head, face and neck: Secondary | ICD-10-CM | POA: Insufficient documentation

## 2015-05-18 DIAGNOSIS — Z510 Encounter for antineoplastic radiation therapy: Secondary | ICD-10-CM | POA: Diagnosis not present

## 2015-05-18 MED ORDER — RADIAPLEXRX EX GEL
Freq: Once | CUTANEOUS | Status: AC
Start: 2015-05-18 — End: 2015-05-18
  Administered 2015-05-18: 14:00:00 via TOPICAL

## 2015-05-18 NOTE — Progress Notes (Signed)
Pt here for patient teaching.  Pt not given radiation booklet, as family stated she has one at home from previous radiation. Pt reports they have watched the Radiation Therapy Education video the previous time she had radiation.  Reviewed areas of pertinence such as fatigue, skin changes, breast tenderness, breast swelling, cough, shortness of breath, earaches and taste changes . Pt able to give teach back of to pat skin, use unscented/gentle soap and drink plenty of water,apply Radiaplex bid and avoid applying anything to skin within 4 hours of treatment. Pt verbalizes understanding of information given and will contact nursing with any questions or concerns.     [email or web address]

## 2015-05-18 NOTE — Progress Notes (Signed)
  Radiation Oncology         (336) [phone] ________________________________  Name: Erin Beasley MRN: [id number]  Date: 05/18/2015  DOB: 1928/05/28  Weekly Radiation Therapy Management    ICD-9-CM ICD-10-CM   1. 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 . . . . . . . The patient presents for routine under treatment assessment.                                   The patient is without complaint.                                 Set-up films were reviewed.                                 The chart was checked. Physical Findings. . . The lungs are clear. The heart has regular rhythm and rate. The left supraclavicular mass is unchanged less far. Impression . . . . . . . The patient is tolerating radiation. Plan . . . . . . . . . . . . Continue treatment as planned.  ________________________________   Blair Promise, PhD, MD

## 2015-05-18 NOTE — Progress Notes (Signed)
  Radiation Oncology         (336) [phone] ________________________________  Name: Erin Beasley MRN: [id number]  Date: 05/18/2015  DOB: [birth year]  Simulation Verification Note    ICD-9-CM ICD-10-CM   1. 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 The clinical setup was verified. Then port films were obtained and uploaded to the radiation oncology medical record software.  The treatment beams were carefully compared against the planned radiation fields. The position location and shape of the radiation fields was reviewed. They targeted volume of tissue appears to be appropriately covered by the radiation beams. Organs at risk appear to be excluded as planned.  Based on my personal review, I approved the simulation verification. The patient's treatment will proceed as planned.  -----------------------------------  Blair Promise, PhD, MD

## 2015-05-18 NOTE — Progress Notes (Signed)
Erin Beasley is here for her 1st fraction of radiation to her LSclav area. She denies any pain at this time. She denies any skin problems at this time to her radiation site. She has been given radiaplex cream and instructed on how to apply it.   BP 94/63 mmHg  Pulse 61  Ht 5\' 6"  (1.676 m)  Wt 154 lb 1.6 oz (69.899 kg)  BMI 24.88 kg/m2   Wt Readings from Last 3 Encounters:  05/18/15 154 lb 1.6 oz (69.899 kg)  05/10/15 159 lb 9.6 oz (72.394 kg)  05/06/15 154 lb (69.854 kg)

## 2015-05-19 ENCOUNTER — Other Ambulatory Visit: Payer: Self-pay | Admitting: Hematology & Oncology

## 2015-05-19 ENCOUNTER — Ambulatory Visit
Admission: RE | Admit: 2015-05-19 | Discharge: 2015-05-19 | Disposition: A | Discharge status: Home / Self Care | Payer: Medicare Other | Source: Ambulatory Visit | Attending: Radiation Oncology | Admitting: Radiation Oncology

## 2015-05-19 DIAGNOSIS — Z510 Encounter for antineoplastic radiation therapy: Secondary | ICD-10-CM | POA: Diagnosis not present

## 2015-05-20 ENCOUNTER — Ambulatory Visit
Admission: RE | Admit: 2015-05-20 | Discharge: 2015-05-20 | Disposition: A | Discharge status: Home / Self Care | Payer: Medicare Other | Source: Ambulatory Visit | Attending: Radiation Oncology | Admitting: Radiation Oncology

## 2015-05-20 DIAGNOSIS — Z510 Encounter for antineoplastic radiation therapy: Secondary | ICD-10-CM | POA: Diagnosis not present

## 2015-05-21 ENCOUNTER — Ambulatory Visit
Admission: RE | Admit: 2015-05-21 | Discharge: 2015-05-21 | Disposition: A | Discharge status: Home / Self Care | Payer: Medicare Other | Source: Ambulatory Visit | Attending: Radiation Oncology | Admitting: Radiation Oncology

## 2015-05-21 DIAGNOSIS — Z510 Encounter for antineoplastic radiation therapy: Secondary | ICD-10-CM | POA: Diagnosis not present

## 2015-05-24 ENCOUNTER — Ambulatory Visit
Admission: RE | Admit: 2015-05-24 | Discharge: 2015-05-24 | Disposition: A | Discharge status: Home / Self Care | Payer: Medicare Other | Source: Ambulatory Visit | Attending: Radiation Oncology | Admitting: Radiation Oncology

## 2015-05-24 DIAGNOSIS — Z510 Encounter for antineoplastic radiation therapy: Secondary | ICD-10-CM | POA: Diagnosis not present

## 2015-05-25 ENCOUNTER — Encounter: Payer: Self-pay | Admitting: Radiation Oncology

## 2015-05-25 ENCOUNTER — Ambulatory Visit
Admission: RE | Admit: 2015-05-25 | Discharge: 2015-05-25 | Disposition: A | Discharge status: Home / Self Care | Payer: Medicare Other | Source: Ambulatory Visit | Attending: Radiation Oncology | Admitting: Radiation Oncology

## 2015-05-25 VITALS — BP 90/54 | HR 78 | Temp 97.6°F | Resp 18 | Ht 66.0 in | Wt 155.9 lb

## 2015-05-25 DIAGNOSIS — Z510 Encounter for antineoplastic radiation therapy: Secondary | ICD-10-CM | POA: Diagnosis not present

## 2015-05-25 DIAGNOSIS — C770 Secondary and unspecified malignant neoplasm of lymph nodes of head, face and neck: Secondary | ICD-10-CM

## 2015-05-25 NOTE — Progress Notes (Signed)
  Radiation Oncology         (336) [phone] ________________________________  Name: Erin Beasley MRN: [id number]  Date: 05/25/2015  DOB: Aug 21, 1928  Weekly Radiation Therapy Management    ICD-9-CM ICD-10-CM   1. 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 . . . . . . . The patient presents for routine under treatment assessment.                                   The patient is without complaint. No itching or discomfort in the treatment area                                 Set-up films were reviewed.                                 The chart was checked. Physical Findings. . .  height is 5\' 6"  (1.676 m) and weight is 155 lb 14.4 oz (70.716 kg). Her temperature is 97.6 F (36.4 C). Her blood pressure is 90/54 and her pulse is 78. Her respiration is 18 and oxygen saturation is 96%. . The supraclavicular mass is unchanged on evaluation today and continues to be guite  firm with palpation Impression . . . . . . . The patient is tolerating radiation. Plan . . . . . . . . . . . . Continue treatment as planned.  ________________________________   Blair Promise, PhD, MD

## 2015-05-25 NOTE — Progress Notes (Signed)
Erin Beasley is here for her under treat visit, she has had 6 fractions to her left subclavian, pt states good appetite with little fatigue, no redness noted on her skin, pt is unable to recall her medications for med rec review  BP 90/54 mmHg  Pulse 78  Temp(Src) 97.6 F (36.4 C)  Resp 18  Ht 5\' 6"  (1.676 m)  Wt 155 lb 14.4 oz (70.716 kg)  BMI 25.18 kg/m2  SpO2 96%

## 2015-05-26 ENCOUNTER — Ambulatory Visit
Admission: RE | Admit: 2015-05-26 | Discharge: 2015-05-26 | Disposition: A | Discharge status: Home / Self Care | Payer: Medicare Other | Source: Ambulatory Visit | Attending: Radiation Oncology | Admitting: Radiation Oncology

## 2015-05-26 DIAGNOSIS — Z510 Encounter for antineoplastic radiation therapy: Secondary | ICD-10-CM | POA: Diagnosis not present

## 2015-05-27 ENCOUNTER — Ambulatory Visit
Admission: RE | Admit: 2015-05-27 | Discharge: 2015-05-27 | Disposition: A | Discharge status: Home / Self Care | Payer: Medicare Other | Source: Ambulatory Visit | Attending: Radiation Oncology | Admitting: Radiation Oncology

## 2015-05-27 DIAGNOSIS — Z510 Encounter for antineoplastic radiation therapy: Secondary | ICD-10-CM | POA: Diagnosis not present

## 2015-05-28 ENCOUNTER — Ambulatory Visit
Admission: RE | Admit: 2015-05-28 | Discharge: 2015-05-28 | Disposition: A | Discharge status: Home / Self Care | Payer: Medicare Other | Source: Ambulatory Visit | Attending: Radiation Oncology | Admitting: Radiation Oncology

## 2015-05-28 DIAGNOSIS — Z510 Encounter for antineoplastic radiation therapy: Secondary | ICD-10-CM | POA: Diagnosis not present

## 2015-05-31 ENCOUNTER — Encounter: Payer: Self-pay | Admitting: Radiation Oncology

## 2015-05-31 ENCOUNTER — Ambulatory Visit
Admission: RE | Admit: 2015-05-31 | Discharge: 2015-05-31 | Disposition: A | Discharge status: Home / Self Care | Payer: Medicare Other | Source: Ambulatory Visit | Attending: Radiation Oncology | Admitting: Radiation Oncology

## 2015-05-31 VITALS — BP 104/66 | HR 81 | Temp 98.3°F | Ht 66.0 in | Wt 156.2 lb

## 2015-05-31 DIAGNOSIS — Z510 Encounter for antineoplastic radiation therapy: Secondary | ICD-10-CM | POA: Diagnosis not present

## 2015-05-31 DIAGNOSIS — C770 Secondary and unspecified malignant neoplasm of lymph nodes of head, face and neck: Secondary | ICD-10-CM

## 2015-05-31 NOTE — Progress Notes (Addendum)
Erin Beasley has completed treatment with 10 fractions to her left subclavian area.  She denies pain, trouble swallowing and having a sore throat.  She denies having fatigue.  The skin on her left subclavian area has slight hyperpigmentation. She is using radiaplex gel.  Patient has been given a one month follow up appointment.  BP 104/66 mmHg  Pulse 81  Temp(Src) 98.3 F (36.8 C) (Oral)  Ht 5\' 6"  (1.676 m)  SpO2 98%

## 2015-05-31 NOTE — Progress Notes (Signed)
  Radiation Oncology         [phone] ________________________________  Name: Erin Beasley MRN: [id number]  Date: 05/31/2015  DOB: 1928/11/18  End of Treatment Note  Diagnosis:   Metastatic breast cancer to the left supraclavicular fossa    Indication for treatment:  Local control       Radiation treatment dates:   05/18/2015 through 05/31/2015  Site/dose:   Left supraclavicular nodal metastasis 30 gray in 10 fractions  Beams/energy:   AP/ PA  Narrative: The patient tolerated radiation treatment relatively well.   Patient denied any itching or discomfort in the area or swallowing difficulties. On last day of treatment the tumor mass that decreased in size to approximately half of its pretreatment volume  Plan: The patient has completed radiation treatment. The patient will return to radiation oncology clinic for routine followup in one month. I advised them to call or return sooner if they have any questions or concerns related to their recovery or treatment.  -----------------------------------  Blair Promise, PhD, MD

## 2015-05-31 NOTE — Progress Notes (Signed)
  Radiation Oncology         [phone] ________________________________  Name: Erin Beasley MRN: [id number]  Date: 05/31/2015  DOB: 1928-04-23  Weekly Radiation Therapy Management    ICD-9-CM ICD-10-CM   1. Secondary malignancy of supraclavicular lymph nodes (HCC) 196.0 C77.0      Current Dose: 30 Gy     Planned Dose:  30 Gy  Narrative . . . . . . . . The patient presents for routine under treatment assessment.                                   The patient is without complaint. Happy to complete her radiation therapy. No complaints of itching or discomfort within the treatment area. No swallowing difficulties or pain with swallowing.                                 Set-up films were reviewed.                                 The chart was checked. Physical Findings. . .  height is 5\' 6"  (1.676 m) and weight is 156 lb 3.2 oz (70.852 kg). Her oral temperature is 98.3 F (36.8 C). Her blood pressure is 104/66 and her pulse is 81. Her oxygen saturation is 98%. . The left supraclavicular mass has decreased to approximately half of its size prior to initiation of treatment.  Minimal hyperpigmentation changes in the treatment area. No skin breakdown  Impression . . . . . . . The patient is tolerating radiation. Plan . . . . . . . . . . . . Routine follow-up in one month.  ________________________________   Blair Promise, PhD, MD

## 2015-05-31 NOTE — Telephone Encounter (Signed)
error 

## 2015-06-18 ENCOUNTER — Encounter: Payer: Self-pay | Admitting: Hematology & Oncology

## 2015-06-18 ENCOUNTER — Other Ambulatory Visit (HOSPITAL_BASED_OUTPATIENT_CLINIC_OR_DEPARTMENT_OTHER): Payer: Medicare Other

## 2015-06-18 ENCOUNTER — Ambulatory Visit (HOSPITAL_BASED_OUTPATIENT_CLINIC_OR_DEPARTMENT_OTHER): Payer: Medicare Other | Admitting: Hematology & Oncology

## 2015-06-18 VITALS — BP 107/88 | HR 70 | Temp 97.5°F | Resp 16 | Ht 66.0 in | Wt 153.0 lb

## 2015-06-18 DIAGNOSIS — Z170 Estrogen receptor positive status [ER+]: Secondary | ICD-10-CM | POA: Diagnosis not present

## 2015-06-18 DIAGNOSIS — C50912 Malignant neoplasm of unspecified site of left female breast: Secondary | ICD-10-CM

## 2015-06-18 DIAGNOSIS — C50012 Malignant neoplasm of nipple and areola, left female breast: Principal | ICD-10-CM

## 2015-06-18 DIAGNOSIS — C50919 Malignant neoplasm of unspecified site of unspecified female breast: Secondary | ICD-10-CM | POA: Diagnosis not present

## 2015-06-18 DIAGNOSIS — C50412 Malignant neoplasm of upper-outer quadrant of left female breast: Secondary | ICD-10-CM

## 2015-06-18 DIAGNOSIS — I4891 Unspecified atrial fibrillation: Secondary | ICD-10-CM

## 2015-06-18 DIAGNOSIS — C770 Secondary and unspecified malignant neoplasm of lymph nodes of head, face and neck: Secondary | ICD-10-CM

## 2015-06-18 DIAGNOSIS — I69359 Hemiplegia and hemiparesis following cerebral infarction affecting unspecified side: Secondary | ICD-10-CM

## 2015-06-18 DIAGNOSIS — C50011 Malignant neoplasm of nipple and areola, right female breast: Secondary | ICD-10-CM

## 2015-06-18 LAB — CMP (CANCER CENTER ONLY)
ALK PHOS: 73 U/L (ref 26–84)
ALT: 19 U/L (ref 10–47)
AST: 33 U/L (ref 11–38)
Albumin: 3.9 g/dL (ref 3.3–5.5)
BILIRUBIN TOTAL: 0.8 mg/dL (ref 0.20–1.60)
BUN: 15 mg/dL (ref 7–22)
CO2: 30 mEq/L (ref 18–33)
Calcium: 9.7 mg/dL (ref 8.0–10.3)
Chloride: 104 mEq/L (ref 98–108)
Creat: 1.4 mg/dl — ABNORMAL HIGH (ref 0.6–1.2)
GLUCOSE: 84 mg/dL (ref 73–118)
Potassium: 4 mEq/L (ref 3.3–4.7)
Sodium: 143 mEq/L (ref 128–145)
TOTAL PROTEIN: 7.1 g/dL (ref 6.4–8.1)

## 2015-06-18 LAB — CBC WITH DIFFERENTIAL (CANCER CENTER ONLY)
BASO#: 0 10*3/uL (ref 0.0–0.2)
BASO%: 0.3 % (ref 0.0–2.0)
EOS%: 1.2 % (ref 0.0–7.0)
Eosinophils Absolute: 0.1 10*3/uL (ref 0.0–0.5)
HCT: 43.5 % (ref 34.8–46.6)
HGB: 14.3 g/dL (ref 11.6–15.9)
LYMPH#: 1.4 10*3/uL (ref 0.9–3.3)
LYMPH%: 23.3 % (ref 14.0–48.0)
MCH: 29.5 pg (ref 26.0–34.0)
MCHC: 32.9 g/dL (ref 32.0–36.0)
MCV: 90 fL (ref 81–101)
MONO#: 0.5 10*3/uL (ref 0.1–0.9)
MONO%: 8.9 % (ref 0.0–13.0)
NEUT#: 3.9 10*3/uL (ref 1.5–6.5)
NEUT%: 66.3 % (ref 39.6–80.0)
PLATELETS: 164 10*3/uL (ref 145–400)
RBC: 4.84 10*6/uL (ref 3.70–5.32)
RDW: 14.7 % (ref 11.1–15.7)
WBC: 5.9 10*3/uL (ref 3.9–10.0)

## 2015-06-18 LAB — PROTIME-INR (CHCC SATELLITE)
INR: 2.7 (ref 2.0–3.5)
Protime: 32.4 Seconds — ABNORMAL HIGH (ref 10.6–13.4)

## 2015-06-18 NOTE — Progress Notes (Signed)
Hematology and Oncology Follow Up Visit  MYANA SZUBA [id number] February 22, 1929 82 y.o. 06/18/2015   Principle Diagnosis:   Metastatic breast cancer-ER positive  CVA with residual right-sided weakness  Chronic atrial fibrillation  Current Therapy:   Observation     Interim History:  Ms.  Baysinger is back for followup. She is now off therapy. We got her off the Faslodex and Ibrance. She was losing some of her hair. This really was upsetting for her. As such, I felt that her quality of life to be better off treatment.  She had a pretty good Christmas. She went to visit her family. She had family in [place].  Unfortunate, her last CA 27.29 went from 53 up to 98. I think this is indicative of activity of her breast cancer. Her left superficial lymph node survey. His to be more prominent.  She's had no cough. She's had no bleeding. She is on Coumadin for the CVA.  She's had no weakness over in the left arm. She's had no change in bowel or bladder habits.  Overall, her performance status is ECOG 2.  Medications:  Current outpatient prescriptions:  .  calcium carbonate (TUMS - DOSED IN MG ELEMENTAL CALCIUM) 500 MG chewable tablet, Chew 1 tablet by mouth daily as needed for indigestion or heartburn. , Disp: , Rfl:  .  Cholecalciferol (VITAMIN D PO), Take 1 tablet by mouth every morning. , Disp: , Rfl:  .  diltiazem (CARDIZEM CD) 120 MG 24 hr capsule, TAKE ONE CAPSULE BY MOUTH EVERY DAY, Disp: 30 capsule, Rfl: 3 .  emollient (RADIAGEL) gel, Apply topically as needed for wound care. Reported on 05/31/2015, Disp: , Rfl:  .  famotidine (PEPCID) 20 MG tablet, Take 20 mg by mouth daily. , Disp: , Rfl:  .  megestrol (MEGACE) 400 MG/10ML suspension, Give 1 Tsp a day in the morning for appetite stimulation, Disp: 240 mL, Rfl: 2 .  memantine (NAMENDA) 5 MG tablet, Take 5 mg by mouth 2 (two) times daily., Disp: , Rfl: 6 .  metoprolol (LOPRESSOR) 50 MG tablet, Take 50 mg by mouth 2 (two)  times daily. Reported on 05/18/2015, Disp: , Rfl:  .  simvastatin (ZOCOR) 20 MG tablet, Take 20 mg by mouth at bedtime. , Disp: , Rfl:  .  warfarin (COUMADIN) 5 MG tablet, Take 2.5 mg by mouth daily. , Disp: , Rfl:  No current facility-administered medications for this visit.  Facility-Administered Medications Ordered in Other Visits:  .  Influenza vac split quadrivalent PF (FLUARIX) injection 0.5 mL, 0.5 mL, Intramuscular, Once, Volanda Napoleon, MD  Allergies:  Allergies  Allergen Reactions  . Misc Natural Products [Iron] Hives    Foam inside neck brace  . Penicillins Rash    Past Medical History, Surgical history, Social history, and Family History were reviewed and updated.  Review of Systems: As above  Physical Exam:  height is 5\' 6"  (1.676 m) and weight is 153 lb (69.4 kg). Her oral temperature is 97.5 F (36.4 C). Her blood pressure is 107/88 and her pulse is 70. Her respiration is 16.   [name]. No obvious palpable cervical or supraclavicular lymph is appreciated. She has some radiation changes in the left neck. Some firmness is noted at the lymph node site. Lungs are clear. Cardiac exam irregular rate and rhythm consistent with atrial fibrillation. She has no murmurs. Chest wall exam shows marked improvement of the the left chest wall lesion. There is no nodularity.  There is no swelling. There is no exudate. There is no bloody discharge. . Abdomen is soft. She has good bowel sounds. There is no palpable liver or spleen. Back exam shows no tenderness over the spine. Extremities shows no clubbing, cyanosis or edema. On the medial aspect of the right foot at the first MT joint, there is a large bunion. This is slightly tender but not erythematous. She has weakness over on the right side. Skin exam no rashes, ecchymoses or petechia. Neurological exam shows the chronic right-sided weakness. Lab Results  Component Value Date   WBC 5.9 06/18/2015   HGB 14.3  06/18/2015   HCT 43.5 06/18/2015   MCV 90 06/18/2015   PLT 164 06/18/2015     Chemistry      Component Value Date/Time   NA 143 06/18/2015 0810   NA 146* 05/06/2015 0819   NA 140 09/10/2014 1945   K 4.0 06/18/2015 0810   K 4.1 05/06/2015 0819   K 4.5 09/10/2014 1945   CL 104 06/18/2015 0810   CL 105 09/10/2014 1945   CL 106 01/12/2012 1332   CO2 30 06/18/2015 0810   CO2 29 05/06/2015 0819   CO2 23 09/10/2014 1945   BUN 15 06/18/2015 0810   BUN 19.6 05/06/2015 0819   BUN 15 09/10/2014 1945   CREATININE 1.4* 06/18/2015 0810   CREATININE 1.1 05/06/2015 0819   CREATININE 1.35* 09/10/2014 1945      Component Value Date/Time   CALCIUM 9.7 06/18/2015 0810   CALCIUM 9.6 05/06/2015 0819   CALCIUM 9.1 09/10/2014 1945   ALKPHOS 73 06/18/2015 0810   ALKPHOS 71 05/06/2015 0819   ALKPHOS 48 08/11/2014 1120   AST 33 06/18/2015 0810   AST 22 05/06/2015 0819   AST 16 08/11/2014 1120   ALT 19 06/18/2015 0810   ALT 9 05/06/2015 0819   ALT 8 08/11/2014 1120   BILITOT 0.80 06/18/2015 0810   BILITOT 0.45 05/06/2015 0819   BILITOT 0.7 08/11/2014 1120         Impression and Plan: Ms. Mcmellon is a 82 year old female. She has metastatic breast cancer. So far, I think she is doing well.   She completed the radiation therapy about 3 weeks ago. She did well with this. By her physical exam, there is nice regression of the left supraclavicular lymph node.   It is very hard for her to do scans. As such, I think we can just follow her clinically for right now.  I will plan to see her back in another 6 weeks. Hopefully, we'll find that the CA 27.29 is decreasing.    Volanda Napoleon, MD 3/3/20178:45 AM

## 2015-06-19 LAB — CANCER ANTIGEN 27-29 (PARALLEL TESTING): CA 27.29: 369 U/mL — AB (ref <38)

## 2015-06-19 LAB — CANCER ANTIGEN 27.29: CAN 27.29: 322 U/mL — AB (ref 0.0–38.6)

## 2015-06-28 ENCOUNTER — Telehealth: Payer: Self-pay | Admitting: Oncology

## 2015-06-28 NOTE — Telephone Encounter (Signed)
[name] (patient's daughter) called and said Erin Beasley has not been eating or swallowing her saliva for the past week.  She also has not been taking her medications for the past 7-10 days.  Erin Beasley is wondering if radiation could be causing her mother to have a sore throat.  She said [name] has denied having any pain.

## 2015-06-29 NOTE — Telephone Encounter (Signed)
Left a message for Erin Beasley advising her that her mom's recent swallowing issues should not be related to her radiation.  Requested a return call.

## 2015-07-01 ENCOUNTER — Ambulatory Visit: Payer: Self-pay | Admitting: Radiation Oncology

## 2015-07-06 ENCOUNTER — Ambulatory Visit
Admission: RE | Admit: 2015-07-06 | Discharge: 2015-07-06 | Disposition: A | Discharge status: Home / Self Care | Payer: Medicare Other | Source: Ambulatory Visit | Attending: Radiation Oncology | Admitting: Radiation Oncology

## 2015-07-06 ENCOUNTER — Encounter: Payer: Self-pay | Admitting: Oncology

## 2015-07-06 VITALS — BP 111/70 | HR 110 | Temp 98.3°F | Resp 18 | Ht 66.0 in | Wt 154.2 lb

## 2015-07-06 DIAGNOSIS — C770 Secondary and unspecified malignant neoplasm of lymph nodes of head, face and neck: Secondary | ICD-10-CM

## 2015-07-06 NOTE — Progress Notes (Signed)
Erin Beasley here for follow up.  She denies having pain or fatigue.  The skin on her left supraclavicular area has hyperpigmentation.  She is using radiaplex.  Her daughter reports her swallowing is better.  Her daughter thinks she was holding her saliva due to using generic denture adhesive.  They have tried to stop using it and has been better.  They have been crushing her pills and putting them in orange juice.  BP 111/70 mmHg  Pulse 110  Temp(Src) 98.3 F (36.8 C) (Oral)  Resp 18  Ht 5\' 6"  (1.676 m)  Wt 154 lb 3.2 oz (69.945 kg)  BMI 24.90 kg/m2  SpO2 100%   Wt Readings from Last 3 Encounters:  07/06/15 154 lb 3.2 oz (69.945 kg)  06/18/15 153 lb (69.4 kg)  05/31/15 156 lb 3.2 oz (70.852 kg)

## 2015-07-06 NOTE — Progress Notes (Signed)
Radiation Oncology         [phone] ________________________________  Name: Erin Beasley MRN: [id number]  Date: 07/06/2015  DOB: [birth year]  Follow-Up Visit Note  CC: Wenda Low, MD  Volanda Napoleon, MD    ICD-9-CM ICD-10-CM   1. Secondary malignancy of supraclavicular lymph nodes (HCC) 196.0 C77.0     Diagnosis: Metastatic breast cancer to the left supraclavicular fossa   Interval Since Last Radiation:  1  months  Narrative:  The patient returns today for routine follow-up.  Patient did have some problems with swallowing and pain with swallowing but family members Attribute this to dental denture adhesive. The patient does not complain of any pain with swallowing this time or difficulties with swallowing or no reports of pain along the left supraclavicular fossa.                              ALLERGIES:  is allergic to misc natural products and penicillins.  Meds: Current Outpatient Prescriptions  Medication Sig Dispense Refill  . diltiazem (CARDIZEM CD) 120 MG 24 hr capsule TAKE ONE CAPSULE BY MOUTH EVERY DAY 30 capsule 3  . emollient (RADIAGEL) gel Apply topically as needed for wound care. Reported on 05/31/2015    . famotidine (PEPCID) 20 MG tablet Take 20 mg by mouth daily.     . memantine (NAMENDA) 5 MG tablet Take 5 mg by mouth 2 (two) times daily.  6  . metoprolol (LOPRESSOR) 50 MG tablet Take 50 mg by mouth 2 (two) times daily. Reported on 05/18/2015    . simvastatin (ZOCOR) 20 MG tablet Take 20 mg by mouth at bedtime.     Marland Kitchen warfarin (COUMADIN) 5 MG tablet Take 2.5 mg by mouth daily.     . calcium carbonate (TUMS - DOSED IN MG ELEMENTAL CALCIUM) 500 MG chewable tablet Chew 1 tablet by mouth daily as needed for indigestion or heartburn. Reported on 07/06/2015    . Cholecalciferol (VITAMIN D PO) Take 1 tablet by mouth every morning. Reported on 07/06/2015    . megestrol (MEGACE) 400 MG/10ML suspension Give 1 Tsp a day in the morning for appetite stimulation (Patient  not taking: Reported on 07/06/2015) 240 mL 2   No current facility-administered medications for this encounter.   Facility-Administered Medications Ordered in Other Encounters  Medication Dose Route Frequency Provider Last Rate Last Dose  . Influenza vac split quadrivalent PF (FLUARIX) injection 0.5 mL  0.5 mL Intramuscular Once Volanda Napoleon, MD        Physical Findings: The patient is in no acute distress. Patient is alert and oriented.  height is 5\' 6"  (1.676 m) and weight is 154 lb 3.2 oz (69.945 kg). Her oral temperature is 98.3 F (36.8 C). Her blood pressure is 111/70 and her pulse is 110. Her respiration is 18 and oxygen saturation is 100%. . The lungs are clear. The left supraclavicular fossa mass shows good regression . Skin is well healed..  Lab Findings: Lab Results  Component Value Date   WBC 5.9 06/18/2015   HGB 14.3 06/18/2015   HCT 43.5 06/18/2015   MCV 90 06/18/2015   PLT 164 06/18/2015    Radiographic Findings: No results found.  Impression:  The patient is recovering from the effects of radiation.  Favorable response to palliative radiation therapy.  Plan:  When necessary follow-up in radiation oncology. The patient will continue close follow-up with medical oncology.  ____________________________________ Gery Pray,  MD     

## 2015-07-30 ENCOUNTER — Other Ambulatory Visit (HOSPITAL_BASED_OUTPATIENT_CLINIC_OR_DEPARTMENT_OTHER): Payer: Medicare Other

## 2015-07-30 ENCOUNTER — Encounter: Payer: Self-pay | Admitting: Hematology & Oncology

## 2015-07-30 ENCOUNTER — Ambulatory Visit (HOSPITAL_BASED_OUTPATIENT_CLINIC_OR_DEPARTMENT_OTHER): Payer: Medicare Other | Admitting: Hematology & Oncology

## 2015-07-30 VITALS — BP 90/61 | HR 116 | Temp 97.3°F | Resp 14 | Ht 66.0 in | Wt 152.0 lb

## 2015-07-30 DIAGNOSIS — Z170 Estrogen receptor positive status [ER+]: Secondary | ICD-10-CM | POA: Diagnosis not present

## 2015-07-30 DIAGNOSIS — I482 Chronic atrial fibrillation: Secondary | ICD-10-CM | POA: Diagnosis not present

## 2015-07-30 DIAGNOSIS — C50919 Malignant neoplasm of unspecified site of unspecified female breast: Secondary | ICD-10-CM

## 2015-07-30 DIAGNOSIS — C770 Secondary and unspecified malignant neoplasm of lymph nodes of head, face and neck: Secondary | ICD-10-CM

## 2015-07-30 DIAGNOSIS — I4819 Other persistent atrial fibrillation: Secondary | ICD-10-CM

## 2015-07-30 DIAGNOSIS — C50412 Malignant neoplasm of upper-outer quadrant of left female breast: Secondary | ICD-10-CM

## 2015-07-30 LAB — CBC WITH DIFFERENTIAL (CANCER CENTER ONLY)
BASO#: 0 10*3/uL (ref 0.0–0.2)
BASO%: 0.2 % (ref 0.0–2.0)
EOS ABS: 0.1 10*3/uL (ref 0.0–0.5)
EOS%: 1.5 % (ref 0.0–7.0)
HCT: 41.8 % (ref 34.8–46.6)
HGB: 13.6 g/dL (ref 11.6–15.9)
LYMPH#: 1.2 10*3/uL (ref 0.9–3.3)
LYMPH%: 20.5 % (ref 14.0–48.0)
MCH: 29.6 pg (ref 26.0–34.0)
MCHC: 32.5 g/dL (ref 32.0–36.0)
MCV: 91 fL (ref 81–101)
MONO#: 0.5 10*3/uL (ref 0.1–0.9)
MONO%: 8.8 % (ref 0.0–13.0)
NEUT#: 4.1 10*3/uL (ref 1.5–6.5)
NEUT%: 69 % (ref 39.6–80.0)
PLATELETS: 189 10*3/uL (ref 145–400)
RBC: 4.59 10*6/uL (ref 3.70–5.32)
RDW: 14.8 % (ref 11.1–15.7)
WBC: 5.9 10*3/uL (ref 3.9–10.0)

## 2015-07-30 LAB — COMPREHENSIVE METABOLIC PANEL
ALBUMIN: 3.2 g/dL — AB (ref 3.5–5.0)
ALT: 15 U/L (ref 0–55)
AST: 36 U/L — AB (ref 5–34)
Alkaline Phosphatase: 91 U/L (ref 40–150)
Anion Gap: 11 mEq/L (ref 3–11)
BILIRUBIN TOTAL: 0.65 mg/dL (ref 0.20–1.20)
BUN: 18.8 mg/dL (ref 7.0–26.0)
CO2: 28 mEq/L (ref 22–29)
CREATININE: 1.1 mg/dL (ref 0.6–1.1)
Calcium: 9.7 mg/dL (ref 8.4–10.4)
Chloride: 105 mEq/L (ref 98–109)
EGFR: 54 mL/min/{1.73_m2} — ABNORMAL LOW (ref >90)
GLUCOSE: 91 mg/dL (ref 70–140)
Potassium: 4.2 mEq/L (ref 3.5–5.1)
SODIUM: 144 meq/L (ref 136–145)
TOTAL PROTEIN: 6.8 g/dL (ref 6.4–8.3)

## 2015-07-30 LAB — PROTIME-INR (CHCC SATELLITE)
INR: 1.5 — ABNORMAL LOW (ref 2.0–3.5)
Protime: 18 Seconds — ABNORMAL HIGH (ref 10.6–13.4)

## 2015-07-30 NOTE — Progress Notes (Signed)
Hematology and Oncology Follow Up Visit  Erin Beasley [id number] 01/10/1929 81 y.o. 07/30/2015   Principle Diagnosis:   Metastatic breast cancer-ER positive  CVA with residual right-sided weakness  Chronic atrial fibrillation  Current Therapy:   Observation     Interim History:  [name] is back for followup. Her hair is starting to come back a little bit more. This is making her quite happy.  She completed radiation therapy a couple months ago. She did well with this. Her last CA 27.29 was on the higher side. It was 322. We will have to see what his this time.  I think she still on Coumadin. I'm unsure if she is taking this on a regular basis. She really does not like taking medication.  I think she is taking the Megace. This, in some respects, might build help with the breast cancer.  She's had no pain. She's had no fever. She's had no cough. She's had no bleeding. She's had no change in bowel or bladder habits. She's had no leg swelling.   Overall, her performance status is ECOG 2.  Medications:  Current outpatient prescriptions:  .  calcium carbonate (TUMS - DOSED IN MG ELEMENTAL CALCIUM) 500 MG chewable tablet, Chew 1 tablet by mouth daily as needed for indigestion or heartburn. Reported on 07/06/2015, Disp: , Rfl:  .  diltiazem (CARDIZEM CD) 120 MG 24 hr capsule, TAKE ONE CAPSULE BY MOUTH EVERY DAY, Disp: 30 capsule, Rfl: 3 .  megestrol (MEGACE) 400 MG/10ML suspension, Give 1 Tsp a day in the morning for appetite stimulation, Disp: 240 mL, Rfl: 2 .  memantine (NAMENDA) 10 MG tablet, TAKE 1/2 TABLET BY MOUTH FOR 2WEEKS, THEN 1 TABLET TWICE A DAY BY MOUTH FOR 30 DAYS, Disp: , Rfl: 11 .  metoprolol (LOPRESSOR) 50 MG tablet, Take 50 mg by mouth 2 (two) times daily. Reported on 05/18/2015, Disp: , Rfl:  .  simvastatin (ZOCOR) 20 MG tablet, Take 20 mg by mouth at bedtime. , Disp: , Rfl:  .  warfarin (COUMADIN) 5 MG tablet, Take 2.5 mg by mouth daily. , Disp: , Rfl:  .   Cholecalciferol (VITAMIN D PO), Take 1 tablet by mouth every morning. Reported on 07/30/2015, Disp: , Rfl:  .  famotidine (PEPCID) 20 MG tablet, Take 20 mg by mouth daily. Reported on 07/30/2015, Disp: , Rfl:  No current facility-administered medications for this visit.  Facility-Administered Medications Ordered in Other Visits:  .  Influenza vac split quadrivalent PF (FLUARIX) injection 0.5 mL, 0.5 mL, Intramuscular, Once, Volanda Napoleon, MD  Allergies:  Allergies  Allergen Reactions  . Misc Natural Products [Iron] Hives    Foam inside neck brace  . Penicillins Rash    Past Medical History, Surgical history, Social history, and Family History were reviewed and updated.  Review of Systems: As above  Physical Exam:  height is 5\' 6"  (1.676 m) and weight is 152 lb (68.947 kg). Her oral temperature is 97.3 F (36.3 C). Her blood pressure is 90/61 and her pulse is 116. Her respiration is 14.   [name]. No obvious palpable cervical or supraclavicular lymph is appreciated. She has some radiation changes in the left neck. Some firmness is noted at the lymph node site. Lungs are clear. Cardiac exam irregular rate and rhythm consistent with atrial fibrillation. She has no murmurs. Chest wall exam shows marked improvement of the the left chest wall lesion. There is no nodularity. There is no swelling. There  is no exudate. There is no bloody discharge. . Abdomen is soft. She has good bowel sounds. There is no palpable liver or spleen. Back exam shows no tenderness over the spine. Extremities shows no clubbing, cyanosis or edema. On the medial aspect of the right foot at the first MT joint, there is a large bunion. This is slightly tender but not erythematous. She has weakness over on the right side. Skin exam no rashes, ecchymoses or petechia. Neurological exam shows the chronic right-sided weakness. Lab Results  Component Value Date   WBC 5.9 07/30/2015   HGB 13.6 07/30/2015     HCT 41.8 07/30/2015   MCV 91 07/30/2015   PLT 189 07/30/2015     Chemistry      Component Value Date/Time   NA 143 06/18/2015 0810   NA 146* 05/06/2015 0819   NA 140 09/10/2014 1945   K 4.0 06/18/2015 0810   K 4.1 05/06/2015 0819   K 4.5 09/10/2014 1945   CL 104 06/18/2015 0810   CL 105 09/10/2014 1945   CL 106 01/12/2012 1332   CO2 30 06/18/2015 0810   CO2 29 05/06/2015 0819   CO2 23 09/10/2014 1945   BUN 15 06/18/2015 0810   BUN 19.6 05/06/2015 0819   BUN 15 09/10/2014 1945   CREATININE 1.4* 06/18/2015 0810   CREATININE 1.1 05/06/2015 0819   CREATININE 1.35* 09/10/2014 1945      Component Value Date/Time   CALCIUM 9.7 06/18/2015 0810   CALCIUM 9.6 05/06/2015 0819   CALCIUM 9.1 09/10/2014 1945   ALKPHOS 73 06/18/2015 0810   ALKPHOS 71 05/06/2015 0819   ALKPHOS 48 08/11/2014 1120   AST 33 06/18/2015 0810   AST 22 05/06/2015 0819   AST 16 08/11/2014 1120   ALT 19 06/18/2015 0810   ALT 9 05/06/2015 0819   ALT 8 08/11/2014 1120   BILITOT 0.80 06/18/2015 0810   BILITOT 0.45 05/06/2015 0819   BILITOT 0.7 08/11/2014 1120         Impression and Plan: Erin Beasley is a 82 year old female. She has metastatic breast cancer. So far, I think she is doing well.   She completed radiation therapy about 2 Months ago. She did well with this. By her physical exam, there is nice regression of the left supraclavicular lymph node.   I am troubled by the fact that her CA 27.29 when up. Maybe, it will begin to come down. I cannot find anything on her physical exam that looks suspicious.  She really is not a candidate for any additional therapy.  Her quality of life is what is important.  She will be moving with her daughter. Hopefully, she will still be able to come back to see [place]. If not, we can slowly find an oncologist closer to her daughter's home.  Ideally, we can probably get her back to see [place] in about 2 months.   Volanda Napoleon, MD 4/14/20178:52 AM

## 2015-07-31 LAB — CANCER ANTIGEN 27.29: CA 27.29: 819.8 U/mL — ABNORMAL HIGH (ref 0.0–38.6)

## 2015-08-21 IMAGING — PT NM PET TUM IMG RESTAG (PS) SKULL BASE T - THIGH
8 series · 25 of 25 positions shown · non-contrast
Comparison: 12/01/2013

CLINICAL DATA: Subsequent treatment strategy for restaging of
breast cancer. Left-sided. Status post radiation therapy for
metastatic disease. Chest wall recurrence..

EXAM:
NUCLEAR MEDICINE PET SKULL BASE TO THIGH
TECHNIQUE: 8.3 mCi F-18 FDG was injected intravenously. Full-ring PET imaging
was performed from the skull base to thigh after the radiotracer. CT
data was obtained and used for attenuation correction and anatomic
localization.
FASTING BLOOD GLUCOSE:  Value: 88 mg/dl

[Series 3: pet sk_thigh ac · axial · 5.0mm · 4.07mm/px · z∈[-471,+357]mm · 4 of 208 slices shown]
[im 1/208]
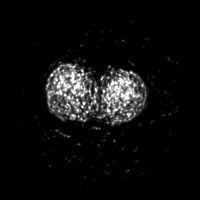
[im 70/208]
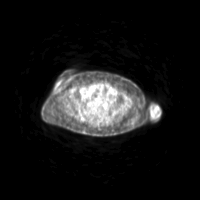
[im 139/208]
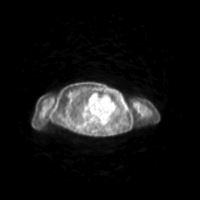
[im 208/208]
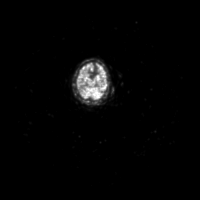

[Series 4: ct sk_thigh 5.0 hd_fov · axial · 5.0mm · 1.17mm/px · z∈[-471,+357]mm · 5 of 208 slices shown]
[im 1/208]
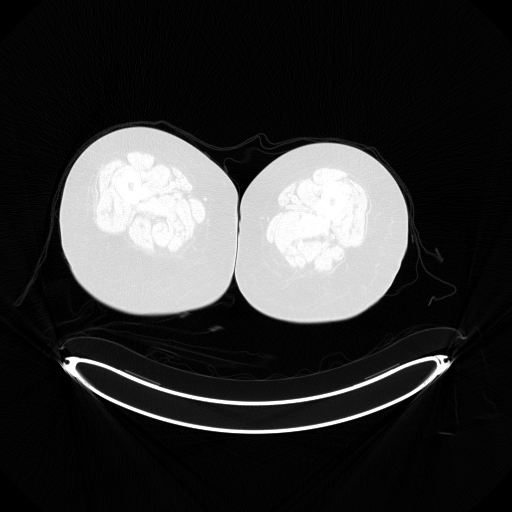
[im 52/208]
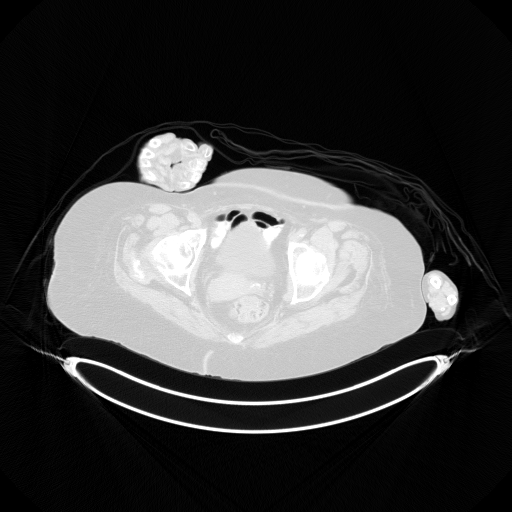
[im 104/208]
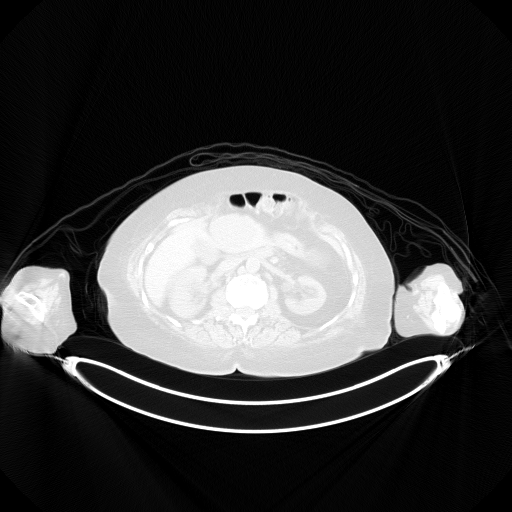
[im 156/208]
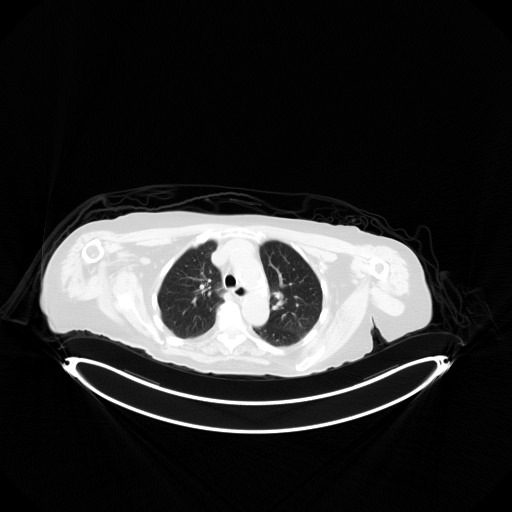
[im 208/208]
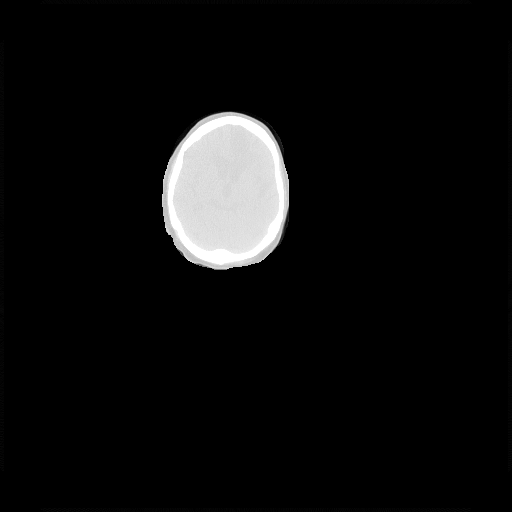

[Series 6: ct sk_thigh 5.0 b70f lung_(id) · axial · 5.0mm · 0.61mm/px · z∈[-9,+239]mm · 2 of 63 slices shown]
[im 1/63  bone]
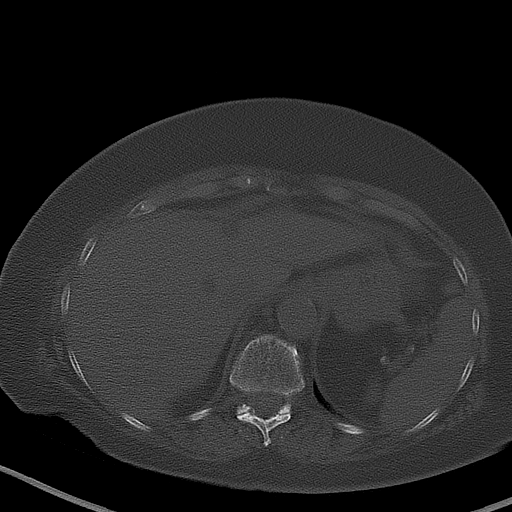
[im 63/63  bone]
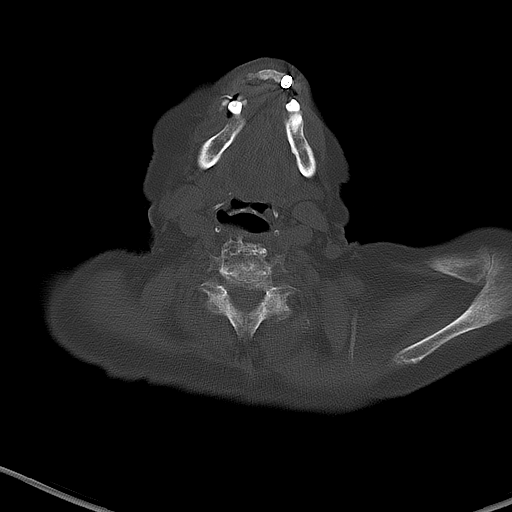

[Series 8: pet sk_thigh nac · axial · 5.0mm · 4.07mm/px · z∈[-471,+357]mm · 5 of 208 slices shown]
[im 1/208]
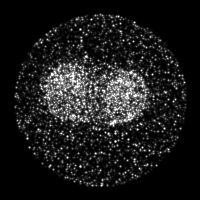
[im 52/208]
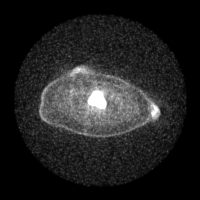
[im 104/208]
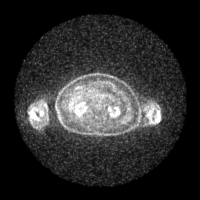
[im 156/208]
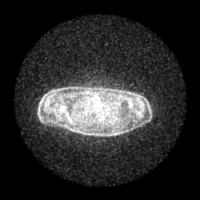
[im 208/208]
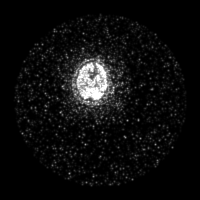

[Series 603: mip collection<mip range> · coronal · 1.72mm/px · 1 of 32 slices shown]
[im 1/32]
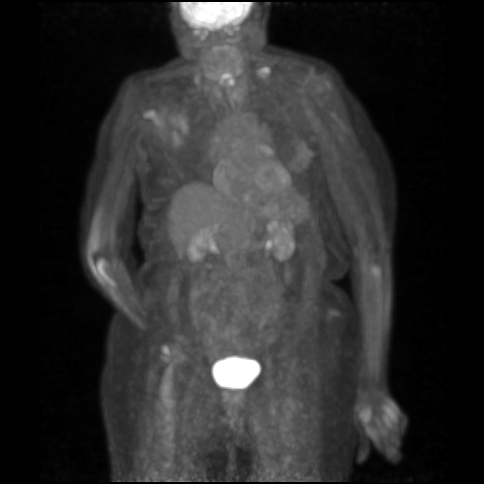

[Series 604: range-ct sk_thigh 5.0 hd_fov-cor-<alpha range> · 2 of 88 slices shown]
[im 1/88]
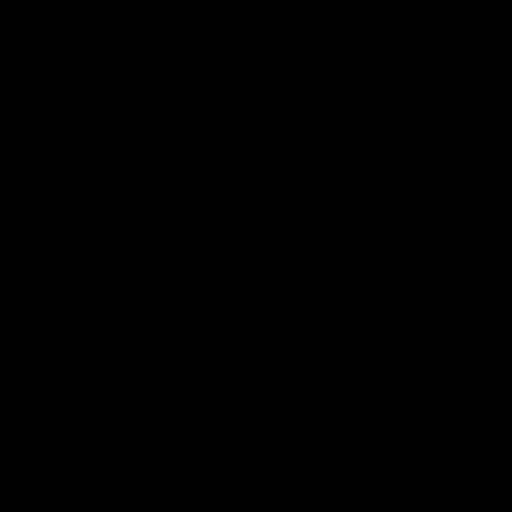
[im 88/88]
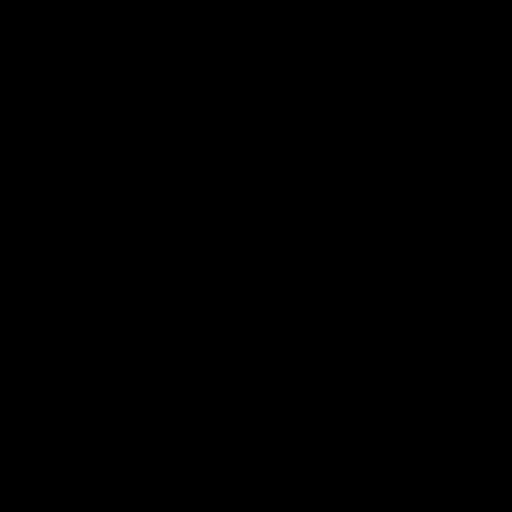

[Series 605: range-ct sk_thigh 5.0 hd_fov-tra-<alpha range> · 5 of 198 slices shown]
[im 1/198]
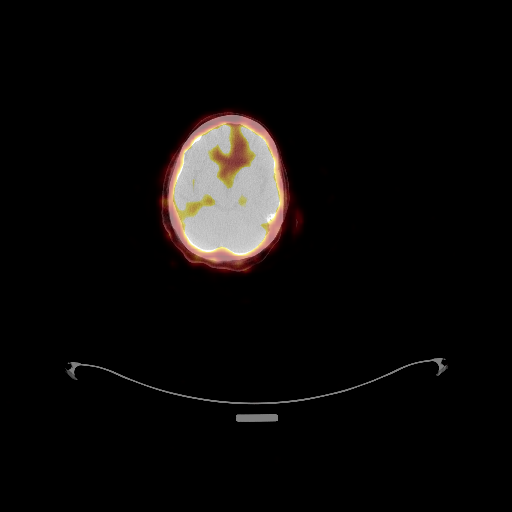
[im 50/198]
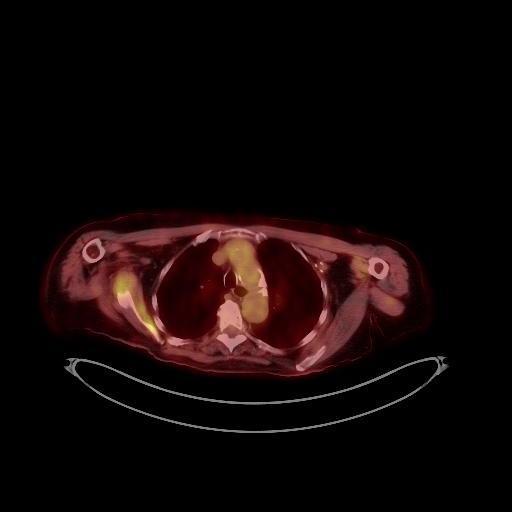
[im 99/198]
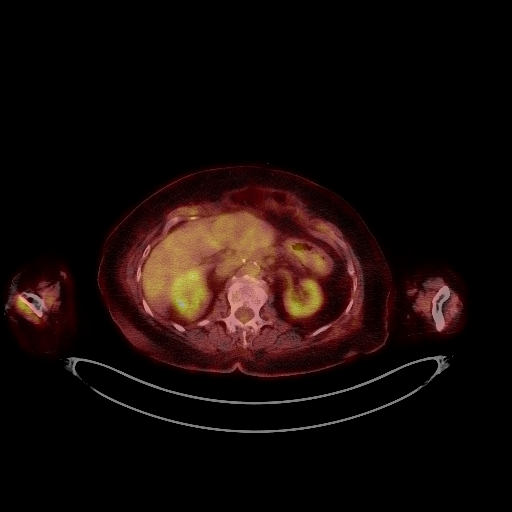
[im 148/198]
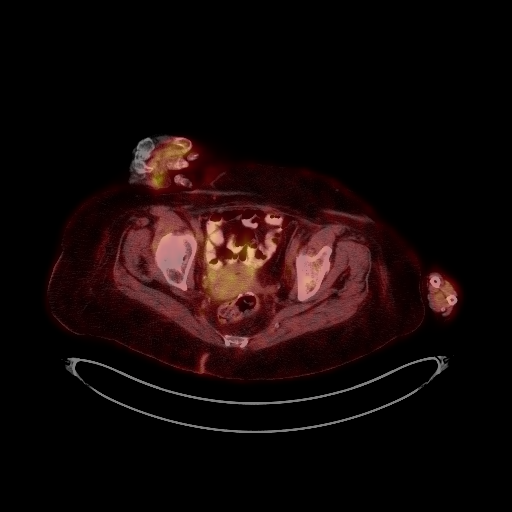
[im 198/198]
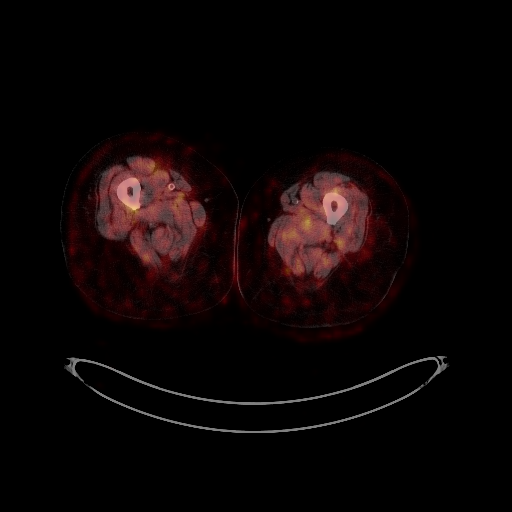

[Series 1317: results mm oncology reading · 0.94mm/px · 1 of 4 slices shown]
[im 1/4]
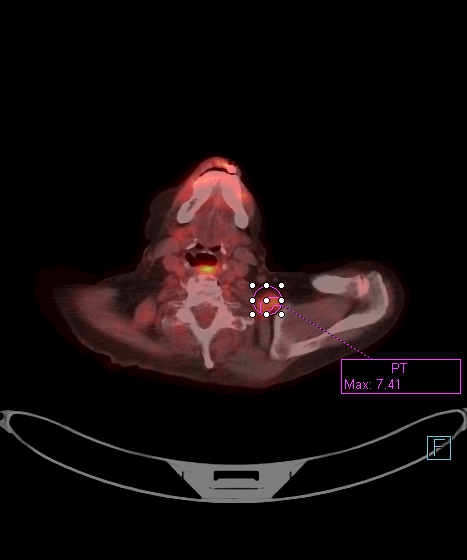

[25 of 25 positions shown; findings below may reference images not displayed]

FINDINGS: NECK

Low left jugular /left supraclavicular node measures 1.4 cm and a
S.U.V. max of 7.4 on image 31. Decreased from 1.7 cm (when
remeasured) and a S.U.V. max of 12.5 on the prior exam.

CHEST

Left chest wall mass is essentially resolved. There is residual skin
thickening on image 68 of series 4. Low-level hypermetabolism,
measuring a S.U.V. max of 2.8. On the prior exam, this measured a
S.U.V. max of 18.0.

ABDOMEN/PELVIS

Dominant lateral segment left liver lobe mass which is hypo
attenuating. Demonstrates low-level hypermetabolism, similar to the
surrounding liver. This measures a S.U.V. max of 3.2, similar. 5.6 x
3.8 cm on image 105. 5.9 x 3.4 cm at the same level on the prior
exam. Present grossly similar back to 02/23/2012, suggesting a
benign etiology.

SKELETON

Hypermetabolism about the right glenoid. This measures a S.U.V. max
of 5.1. No well-defined osseous lesion in this area. On the prior
exam, this measured a S.U.V. max of 3.7.

CT IMAGES PERFORMED FOR ATTENUATION CORRECTION

Suspicion of remote left frontal infarct with encephalomalacia.
Suboptimally evaluated. Cerebral atrophy.

Thyroid enlargement and heterogeneity, nonspecific this age group.
Mild cardiomegaly. LAD coronary artery atherosclerosis. Other
low-density liver lesions which are likely cysts. Upper pole left
renal low-density lesion which is likely a cyst. Mild renal atrophy.
Ventral abdominal wall laxity. Air within the vaginal fornices on
image 159 and within the central uterus on image 151. Chronic.
IMPRESSION: 1. Response to therapy of left chest wall mass and left
supraclavicular adenopathy.
2. Right scapular/glenoid hypermetabolism is slightly increased, and
suspicious for an otherwise occult osseous metastasis.
3. Left hepatic mass which has been similar in size over multiple
prior exams. Favored to represent a benign lesion, such as a
hemangioma.
4. Air within the vaginal fornices and uterus. This is similar to on
the prior exam. Correlate with symptoms to suggest endometritis or
fistulous communication from bowel to uterus or vagina.

## 2015-10-01 ENCOUNTER — Encounter: Payer: Self-pay | Admitting: Hematology & Oncology

## 2015-10-01 ENCOUNTER — Other Ambulatory Visit (HOSPITAL_BASED_OUTPATIENT_CLINIC_OR_DEPARTMENT_OTHER): Payer: Medicare Other

## 2015-10-01 ENCOUNTER — Ambulatory Visit (HOSPITAL_BASED_OUTPATIENT_CLINIC_OR_DEPARTMENT_OTHER): Payer: Medicare Other | Admitting: Hematology & Oncology

## 2015-10-01 VITALS — BP 102/53 | HR 86 | Temp 98.0°F | Resp 14 | Ht 66.0 in | Wt 147.0 lb

## 2015-10-01 DIAGNOSIS — C50919 Malignant neoplasm of unspecified site of unspecified female breast: Secondary | ICD-10-CM

## 2015-10-01 DIAGNOSIS — C770 Secondary and unspecified malignant neoplasm of lymph nodes of head, face and neck: Secondary | ICD-10-CM | POA: Diagnosis not present

## 2015-10-01 DIAGNOSIS — C50012 Malignant neoplasm of nipple and areola, left female breast: Secondary | ICD-10-CM

## 2015-10-01 DIAGNOSIS — I481 Persistent atrial fibrillation: Secondary | ICD-10-CM | POA: Diagnosis not present

## 2015-10-01 DIAGNOSIS — I482 Chronic atrial fibrillation: Secondary | ICD-10-CM

## 2015-10-01 DIAGNOSIS — Z170 Estrogen receptor positive status [ER+]: Secondary | ICD-10-CM | POA: Diagnosis not present

## 2015-10-01 DIAGNOSIS — I4819 Other persistent atrial fibrillation: Secondary | ICD-10-CM

## 2015-10-01 DIAGNOSIS — Z7901 Long term (current) use of anticoagulants: Secondary | ICD-10-CM

## 2015-10-01 LAB — PROTIME-INR (CHCC SATELLITE)

## 2015-10-01 LAB — CBC WITH DIFFERENTIAL (CANCER CENTER ONLY)
BASO#: 0 10*3/uL (ref 0.0–0.2)
BASO%: 0.2 % (ref 0.0–2.0)
EOS ABS: 0.1 10*3/uL (ref 0.0–0.5)
EOS%: 1.3 % (ref 0.0–7.0)
HEMATOCRIT: 41.3 % (ref 34.8–46.6)
HEMOGLOBIN: 13.6 g/dL (ref 11.6–15.9)
LYMPH#: 1 10*3/uL (ref 0.9–3.3)
LYMPH%: 15.7 % (ref 14.0–48.0)
MCH: 29.9 pg (ref 26.0–34.0)
MCHC: 32.9 g/dL (ref 32.0–36.0)
MCV: 91 fL (ref 81–101)
MONO#: 0.7 10*3/uL (ref 0.1–0.9)
MONO%: 11 % (ref 0.0–13.0)
NEUT%: 71.8 % (ref 39.6–80.0)
NEUTROS ABS: 4.6 10*3/uL (ref 1.5–6.5)
Platelets: 219 10*3/uL (ref 145–400)
RBC: 4.55 10*6/uL (ref 3.70–5.32)
RDW: 16.1 % — ABNORMAL HIGH (ref 11.1–15.7)
WBC: 6.4 10*3/uL (ref 3.9–10.0)

## 2015-10-01 LAB — PROTHROMBIN TIME (PT)
INR: 4.5 — ABNORMAL HIGH (ref 0.8–1.2)
Prothrombin Time: 50.4 s — ABNORMAL HIGH (ref 9.1–12.0)

## 2015-10-01 LAB — COMPREHENSIVE METABOLIC PANEL
ALBUMIN: 3.1 g/dL — AB (ref 3.5–5.0)
ALK PHOS: 231 U/L — AB (ref 40–150)
ALT: 23 U/L (ref 0–55)
AST: 71 U/L — AB (ref 5–34)
Anion Gap: 10 mEq/L (ref 3–11)
BUN: 17.5 mg/dL (ref 7.0–26.0)
CO2: 26 mEq/L (ref 22–29)
Calcium: 9.8 mg/dL (ref 8.4–10.4)
Chloride: 108 mEq/L (ref 98–109)
Creatinine: 1.3 mg/dL — ABNORMAL HIGH (ref 0.6–1.1)
EGFR: 43 mL/min/{1.73_m2} — ABNORMAL LOW (ref >90)
GLUCOSE: 80 mg/dL (ref 70–140)
POTASSIUM: 4.1 meq/L (ref 3.5–5.1)
SODIUM: 144 meq/L (ref 136–145)
TOTAL PROTEIN: 6.9 g/dL (ref 6.4–8.3)
Total Bilirubin: 1.07 mg/dL (ref 0.20–1.20)

## 2015-10-01 NOTE — Progress Notes (Signed)
Hematology and Oncology Follow Up Visit  Erin Beasley [id number] Jul 20, 1928 82 y.o. 10/01/2015   Principle Diagnosis:   Metastatic breast cancer-ER positive  CVA with residual right-sided weakness  Chronic atrial fibrillation  Current Therapy:   Observation     Interim History:  Erin Beasley is back for followup. She looks quite good. She is with her daughter now. Will she and her husband are living with her daughter. This is really help them out.  Her hair is looking quite nice. She really is worried about her hair and wants to make sure that her hair is not affected by anything that we do.  Of note, her CA 27.29 has been going up quickly. We checked her back in April it was over 800. Previously, it was 322.  She will not take any chemotherapy. Again, her her primary goal is quality of life. She has had a stroke. She has weakness on the right side.  She's eating okay. She has had no nausea or vomiting.  She's not having any issues with bowels or bladder.   Overall, her performance status is ECOG 2.  Medications:  Current outpatient prescriptions:  .  calcium carbonate (TUMS - DOSED IN MG ELEMENTAL CALCIUM) 500 MG chewable tablet, Chew 1 tablet by mouth daily as needed for indigestion or heartburn. Reported on 07/06/2015, Disp: , Rfl:  .  Cholecalciferol (VITAMIN D PO), Take 1 tablet by mouth every morning. Reported on 07/30/2015, Disp: , Rfl:  .  diltiazem (CARDIZEM CD) 120 MG 24 hr capsule, TAKE ONE CAPSULE BY MOUTH EVERY DAY, Disp: 30 capsule, Rfl: 3 .  famotidine (PEPCID) 20 MG tablet, Take 20 mg by mouth daily. Reported on 07/30/2015, Disp: , Rfl:  .  memantine (NAMENDA) 10 MG tablet, TAKE 1/2 TABLET BY MOUTH FOR 2WEEKS, THEN 1 TABLET TWICE A DAY BY MOUTH FOR 30 DAYS, Disp: , Rfl: 11 .  metoprolol (LOPRESSOR) 50 MG tablet, Take 50 mg by mouth 2 (two) times daily. Reported on 05/18/2015, Disp: , Rfl:  .  simvastatin (ZOCOR) 20 MG tablet, Take 20 mg by mouth at bedtime. ,  Disp: , Rfl:  No current facility-administered medications for this visit.  Facility-Administered Medications Ordered in Other Visits:  .  Influenza vac split quadrivalent PF (FLUARIX) injection 0.5 mL, 0.5 mL, Intramuscular, Once, Volanda Napoleon, MD  Allergies:  Allergies  Allergen Reactions  . Misc Natural Products [Iron] Hives    Foam inside neck brace  . Penicillins Rash    Past Medical History, Surgical history, Social history, and Family History were reviewed and updated.  Review of Systems: As above  Physical Exam:  height is 5\' 6"  (1.676 m) and weight is 147 lb (66.679 kg). Her oral temperature is 98 F (36.7 C). Her blood pressure is 102/53 and her pulse is 86. Her respiration is 14.   [name]. No obvious palpable cervical or supraclavicular lymph is appreciated. She has some radiation changes in the left neck. Some firmness is noted at the lymph node site. Lungs are clear. Cardiac exam irregular rate and rhythm consistent with atrial fibrillation. She has no murmurs. Chest wall exam shows marked improvement of the the left chest wall lesion. There is no nodularity. There is no swelling. There is no exudate. There is no bloody discharge. . Abdomen is soft. She has good bowel sounds. There is no palpable liver or spleen. Back exam shows no tenderness over the spine. Extremities shows no clubbing, cyanosis or  edema. On the medial aspect of the right foot at the first MT joint, there is a large bunion. This is slightly tender but not erythematous. She has weakness over on the right side. Skin exam no rashes, ecchymoses or petechia. Neurological exam shows the chronic right-sided weakness. Lab Results  Component Value Date   WBC 6.4 10/01/2015   HGB 13.6 10/01/2015   HCT 41.3 10/01/2015   MCV 91 10/01/2015   PLT 219 10/01/2015     Chemistry      Component Value Date/Time   NA 144 07/30/2015 0809   NA 143 06/18/2015 0810   NA 140 09/10/2014 1945   K  4.2 07/30/2015 0809   K 4.0 06/18/2015 0810   K 4.5 09/10/2014 1945   CL 104 06/18/2015 0810   CL 105 09/10/2014 1945   CL 106 01/12/2012 1332   CO2 28 07/30/2015 0809   CO2 30 06/18/2015 0810   CO2 23 09/10/2014 1945   BUN 18.8 07/30/2015 0809   BUN 15 06/18/2015 0810   BUN 15 09/10/2014 1945   CREATININE 1.1 07/30/2015 0809   CREATININE 1.4* 06/18/2015 0810   CREATININE 1.35* 09/10/2014 1945      Component Value Date/Time   CALCIUM 9.7 07/30/2015 0809   CALCIUM 9.7 06/18/2015 0810   CALCIUM 9.1 09/10/2014 1945   ALKPHOS 91 07/30/2015 0809   ALKPHOS 73 06/18/2015 0810   ALKPHOS 48 08/11/2014 1120   AST 36* 07/30/2015 0809   AST 33 06/18/2015 0810   AST 16 08/11/2014 1120   ALT 15 07/30/2015 0809   ALT 19 06/18/2015 0810   ALT 8 08/11/2014 1120   BILITOT 0.65 07/30/2015 0809   BILITOT 0.80 06/18/2015 0810   BILITOT 0.7 08/11/2014 1120         Impression and Plan: Erin Beasley is a 82 year old female. She has metastatic breast cancer. So far, I think she is doing well.   She completed radiation therapy about 4 Months ago. She did well with this. By her physical exam, there is nice regression of the left supraclavicular lymph node.   I am troubled by the fact that her CA 27.29 when up. We will see what it is today. If it is higher, then I think a PET scan would be helpful. Again, I know we are not going to treat her with chemotherapy but at least, we could see how quickly things are growing and where the areas of metastasis are.  Of note, she is being taken off Coumadin and being placed on ELIQUIS for the CVA. She has atrial fibrillation. I think this is a great idea.  We will plan to get her back in 3 months. We'll get her back much sooner if we have to do a PET scan.  I spent about 25-30 minutes with she and her family.    Volanda Napoleon, MD 6/16/20179:10 AM

## 2015-10-02 LAB — CANCER ANTIGEN 27.29

## 2015-10-04 ENCOUNTER — Other Ambulatory Visit: Payer: Self-pay | Admitting: Hematology & Oncology

## 2015-10-04 DIAGNOSIS — C50019 Malignant neoplasm of nipple and areola, unspecified female breast: Secondary | ICD-10-CM

## 2015-10-07 ENCOUNTER — Other Ambulatory Visit: Payer: Self-pay | Admitting: Hematology & Oncology

## 2015-10-25 ENCOUNTER — Ambulatory Visit (HOSPITAL_COMMUNITY)
Admission: RE | Admit: 2015-10-25 | Discharge: 2015-10-25 | Disposition: A | Discharge status: Home / Self Care | Payer: Medicare Other | Source: Ambulatory Visit | Attending: Hematology & Oncology | Admitting: Hematology & Oncology

## 2015-10-25 DIAGNOSIS — C7801 Secondary malignant neoplasm of right lung: Secondary | ICD-10-CM | POA: Diagnosis not present

## 2015-10-25 DIAGNOSIS — C50019 Malignant neoplasm of nipple and areola, unspecified female breast: Secondary | ICD-10-CM | POA: Diagnosis present

## 2015-10-25 DIAGNOSIS — R188 Other ascites: Secondary | ICD-10-CM | POA: Diagnosis not present

## 2015-10-25 DIAGNOSIS — C7951 Secondary malignant neoplasm of bone: Secondary | ICD-10-CM | POA: Diagnosis not present

## 2015-10-25 DIAGNOSIS — C787 Secondary malignant neoplasm of liver and intrahepatic bile duct: Secondary | ICD-10-CM | POA: Diagnosis not present

## 2015-10-25 DIAGNOSIS — R590 Localized enlarged lymph nodes: Secondary | ICD-10-CM | POA: Insufficient documentation

## 2015-10-25 LAB — GLUCOSE, CAPILLARY: GLUCOSE-CAPILLARY: 87 mg/dL (ref 65–99)

## 2015-10-25 MED ORDER — FLUDEOXYGLUCOSE F - 18 (FDG) INJECTION
7.3200 | Freq: Once | INTRAVENOUS | Status: AC | PRN
  Administered 2015-10-25: 7.32 via INTRAVENOUS

## 2015-11-06 ENCOUNTER — Other Ambulatory Visit: Payer: Self-pay | Admitting: Hematology & Oncology

## 2015-11-08 ENCOUNTER — Other Ambulatory Visit: Payer: Self-pay | Admitting: Hematology & Oncology

## 2015-12-17 DEATH — deceased

## 2015-12-31 ENCOUNTER — Ambulatory Visit: Payer: Medicare Other | Admitting: Hematology & Oncology

## 2015-12-31 ENCOUNTER — Other Ambulatory Visit: Payer: Medicare Other

## 2016-02-11 ENCOUNTER — Other Ambulatory Visit: Payer: Self-pay | Admitting: Family
